# Patient Record
Sex: Female | Born: 1935 | Race: White | Hispanic: No | Smoking: Never smoker
Health system: Southern US, Community
[De-identification: ages and names within clinical notes are randomized; demographics above are authoritative.]

## PROBLEM LIST (undated history)

## (undated) DIAGNOSIS — Z9049 Acquired absence of other specified parts of digestive tract: Secondary | ICD-10-CM

## (undated) DIAGNOSIS — F09 Unspecified mental disorder due to known physiological condition: Secondary | ICD-10-CM

## (undated) DIAGNOSIS — N183 Chronic kidney disease, stage 3 unspecified: Secondary | ICD-10-CM

## (undated) DIAGNOSIS — I2510 Atherosclerotic heart disease of native coronary artery without angina pectoris: Secondary | ICD-10-CM

## (undated) DIAGNOSIS — I480 Paroxysmal atrial fibrillation: Secondary | ICD-10-CM

## (undated) DIAGNOSIS — I10 Essential (primary) hypertension: Secondary | ICD-10-CM

## (undated) DIAGNOSIS — I495 Sick sinus syndrome: Secondary | ICD-10-CM

## (undated) DIAGNOSIS — E785 Hyperlipidemia, unspecified: Secondary | ICD-10-CM

## (undated) DIAGNOSIS — Z950 Presence of cardiac pacemaker: Secondary | ICD-10-CM

## (undated) DIAGNOSIS — R911 Solitary pulmonary nodule: Secondary | ICD-10-CM

## (undated) DIAGNOSIS — R55 Syncope and collapse: Secondary | ICD-10-CM

## (undated) DIAGNOSIS — Z8673 Personal history of transient ischemic attack (TIA), and cerebral infarction without residual deficits: Secondary | ICD-10-CM

## (undated) HISTORY — DX: Hyperlipidemia, unspecified: E78.5

## (undated) HISTORY — DX: Solitary pulmonary nodule: R91.1

## (undated) HISTORY — DX: Paroxysmal atrial fibrillation: I48.0

## (undated) HISTORY — DX: Chronic kidney disease, stage 3 (moderate): N18.3

## (undated) HISTORY — DX: Atherosclerotic heart disease of native coronary artery without angina pectoris: I25.10

## (undated) HISTORY — DX: Syncope and collapse: R55

## (undated) HISTORY — DX: Chronic kidney disease, stage 3 unspecified: N18.30

## (undated) HISTORY — DX: Sick sinus syndrome: I49.5

## (undated) HISTORY — DX: Essential (primary) hypertension: I10

## (undated) HISTORY — DX: Unspecified mental disorder due to known physiological condition: F09

## (undated) HISTORY — DX: Personal history of transient ischemic attack (TIA), and cerebral infarction without residual deficits: Z86.73

## (undated) HISTORY — DX: Acquired absence of other specified parts of digestive tract: Z90.49

## (undated) HISTORY — DX: Presence of cardiac pacemaker: Z95.0

---

## 1997-09-20 ENCOUNTER — Other Ambulatory Visit: Admission: RE | Admit: 1997-09-20 | Discharge: 1997-09-20 | Payer: Self-pay | Admitting: *Deleted

## 1999-01-09 ENCOUNTER — Other Ambulatory Visit: Admission: RE | Admit: 1999-01-09 | Discharge: 1999-01-09 | Payer: Self-pay | Admitting: *Deleted

## 1999-11-05 ENCOUNTER — Encounter (INDEPENDENT_AMBULATORY_CARE_PROVIDER_SITE_OTHER): Payer: Self-pay

## 1999-11-05 ENCOUNTER — Other Ambulatory Visit: Admission: RE | Admit: 1999-11-05 | Discharge: 1999-11-05 | Payer: Self-pay | Admitting: *Deleted

## 2000-01-29 ENCOUNTER — Other Ambulatory Visit: Admission: RE | Admit: 2000-01-29 | Discharge: 2000-01-29 | Payer: Self-pay | Admitting: *Deleted

## 2000-12-30 ENCOUNTER — Encounter: Admission: RE | Admit: 2000-12-30 | Discharge: 2001-03-30 | Payer: Self-pay | Admitting: *Deleted

## 2001-03-03 ENCOUNTER — Other Ambulatory Visit: Admission: RE | Admit: 2001-03-03 | Discharge: 2001-03-03 | Payer: Self-pay | Admitting: *Deleted

## 2002-07-01 ENCOUNTER — Encounter: Payer: Self-pay | Admitting: Gastroenterology

## 2002-07-01 ENCOUNTER — Encounter: Admission: RE | Admit: 2002-07-01 | Discharge: 2002-07-01 | Payer: Self-pay | Admitting: Gastroenterology

## 2002-07-15 ENCOUNTER — Ambulatory Visit (HOSPITAL_COMMUNITY): Admission: RE | Admit: 2002-07-15 | Discharge: 2002-07-15 | Payer: Self-pay | Admitting: Gastroenterology

## 2002-08-01 ENCOUNTER — Encounter: Payer: Self-pay | Admitting: Surgery

## 2002-08-02 ENCOUNTER — Encounter (INDEPENDENT_AMBULATORY_CARE_PROVIDER_SITE_OTHER): Payer: Self-pay | Admitting: *Deleted

## 2002-08-02 ENCOUNTER — Ambulatory Visit (HOSPITAL_COMMUNITY): Admission: RE | Admit: 2002-08-02 | Discharge: 2002-08-03 | Payer: Self-pay | Admitting: Surgery

## 2002-08-02 ENCOUNTER — Encounter: Payer: Self-pay | Admitting: Surgery

## 2002-12-28 ENCOUNTER — Ambulatory Visit (HOSPITAL_COMMUNITY): Admission: RE | Admit: 2002-12-28 | Discharge: 2002-12-28 | Payer: Self-pay | Admitting: Gastroenterology

## 2002-12-28 ENCOUNTER — Encounter (INDEPENDENT_AMBULATORY_CARE_PROVIDER_SITE_OTHER): Payer: Self-pay | Admitting: *Deleted

## 2003-01-02 ENCOUNTER — Ambulatory Visit (HOSPITAL_COMMUNITY): Admission: RE | Admit: 2003-01-02 | Discharge: 2003-01-02 | Payer: Self-pay | Admitting: Gastroenterology

## 2003-07-12 ENCOUNTER — Other Ambulatory Visit: Admission: RE | Admit: 2003-07-12 | Discharge: 2003-07-12 | Payer: Self-pay | Admitting: *Deleted

## 2003-12-19 HISTORY — PX: CATARACT EXTRACTION: SUR2

## 2004-03-07 ENCOUNTER — Encounter: Admission: RE | Admit: 2004-03-07 | Discharge: 2004-03-07 | Payer: Self-pay | Admitting: Gastroenterology

## 2005-01-17 DIAGNOSIS — R55 Syncope and collapse: Secondary | ICD-10-CM

## 2005-01-17 HISTORY — PX: ORIF HIP FRACTURE: SHX2125

## 2005-01-17 HISTORY — DX: Syncope and collapse: R55

## 2005-02-16 ENCOUNTER — Ambulatory Visit: Payer: Self-pay | Admitting: Physical Medicine & Rehabilitation

## 2005-02-16 ENCOUNTER — Inpatient Hospital Stay (HOSPITAL_COMMUNITY): Admission: AD | Admit: 2005-02-16 | Discharge: 2005-02-21 | Payer: Self-pay | Admitting: Orthopaedic Surgery

## 2005-02-21 ENCOUNTER — Inpatient Hospital Stay (HOSPITAL_COMMUNITY)
Admission: RE | Admit: 2005-02-21 | Discharge: 2005-03-03 | Payer: Self-pay | Admitting: Physical Medicine & Rehabilitation

## 2005-02-21 ENCOUNTER — Ambulatory Visit: Payer: Self-pay | Admitting: Physical Medicine & Rehabilitation

## 2005-04-17 HISTORY — PX: PACEMAKER INSERTION: SHX728

## 2005-05-04 ENCOUNTER — Inpatient Hospital Stay (HOSPITAL_COMMUNITY): Admission: AD | Admit: 2005-05-04 | Discharge: 2005-05-20 | Payer: Self-pay | Admitting: Cardiology

## 2005-05-05 DIAGNOSIS — Z950 Presence of cardiac pacemaker: Secondary | ICD-10-CM

## 2005-05-05 HISTORY — DX: Presence of cardiac pacemaker: Z95.0

## 2005-05-15 ENCOUNTER — Encounter: Payer: Self-pay | Admitting: Vascular Surgery

## 2005-06-11 ENCOUNTER — Encounter: Admission: RE | Admit: 2005-06-11 | Discharge: 2005-06-11 | Payer: Self-pay | Admitting: Thoracic Surgery

## 2005-06-17 DIAGNOSIS — I2510 Atherosclerotic heart disease of native coronary artery without angina pectoris: Secondary | ICD-10-CM

## 2005-06-17 HISTORY — DX: Atherosclerotic heart disease of native coronary artery without angina pectoris: I25.10

## 2005-06-28 ENCOUNTER — Inpatient Hospital Stay (HOSPITAL_COMMUNITY): Admission: AD | Admit: 2005-06-28 | Discharge: 2005-07-04 | Payer: Self-pay | Admitting: *Deleted

## 2005-07-18 DIAGNOSIS — Z9049 Acquired absence of other specified parts of digestive tract: Secondary | ICD-10-CM

## 2005-07-18 HISTORY — DX: Acquired absence of other specified parts of digestive tract: Z90.49

## 2005-07-18 HISTORY — PX: LAPAROSCOPIC CHOLECYSTECTOMY: SUR755

## 2005-07-24 ENCOUNTER — Inpatient Hospital Stay (HOSPITAL_COMMUNITY): Admission: EM | Admit: 2005-07-24 | Discharge: 2005-08-02 | Payer: Self-pay | Admitting: Cardiovascular Disease

## 2005-07-28 ENCOUNTER — Encounter (INDEPENDENT_AMBULATORY_CARE_PROVIDER_SITE_OTHER): Payer: Self-pay | Admitting: Cardiovascular Disease

## 2005-11-26 ENCOUNTER — Inpatient Hospital Stay (HOSPITAL_COMMUNITY): Admission: EM | Admit: 2005-11-26 | Discharge: 2005-11-27 | Payer: Self-pay | Admitting: Emergency Medicine

## 2007-07-05 IMAGING — CR DG CHEST 1V PORT
1 series · 1 of 1 positions shown · non-contrast
Comparison: 05/06/05
Left chest tube remains and there is no change in the 15-20% left apical pneumothorax.

CLINICAL DATA: 3rd degree heart block.  Followup.
PORTABLE CHEST- 1 VIEW (9881 hours):

[view not recorded]
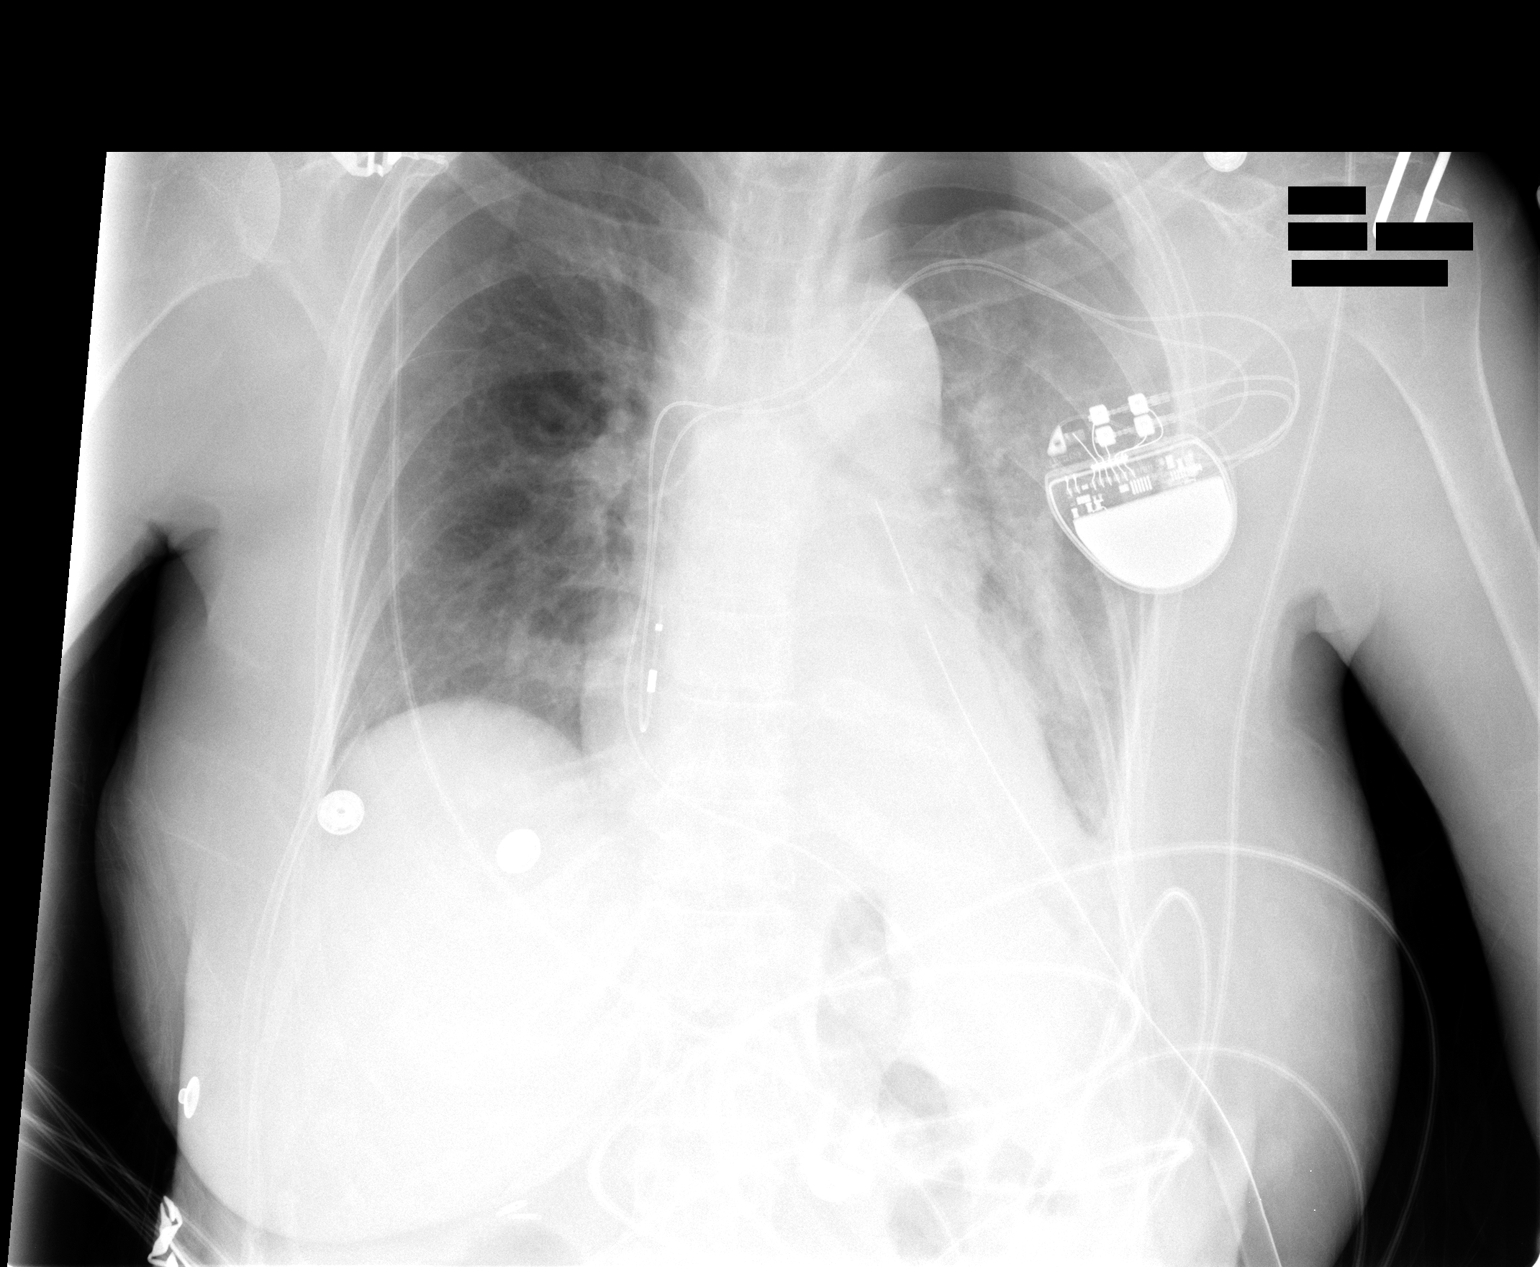

[1 of 1 positions shown; findings below may reference images not displayed]

Opacity remains at the left base consistent with atelectasis and possible residual effusion.  The right lung is clear.  Pacer remains.
IMPRESSION: No change in 15-20% left apical pneumothorax with a left chest tube present.  No change in opacity at the left lung base.

## 2007-07-06 IMAGING — CR DG CHEST 1V PORT
1 series · 1 of 1 positions shown · non-contrast
Comparison: 05/08/05.

CLINICAL DATA: Third-degree heart block.
 PORTABLE CHEST ? 1 VIEW:

[view not recorded]
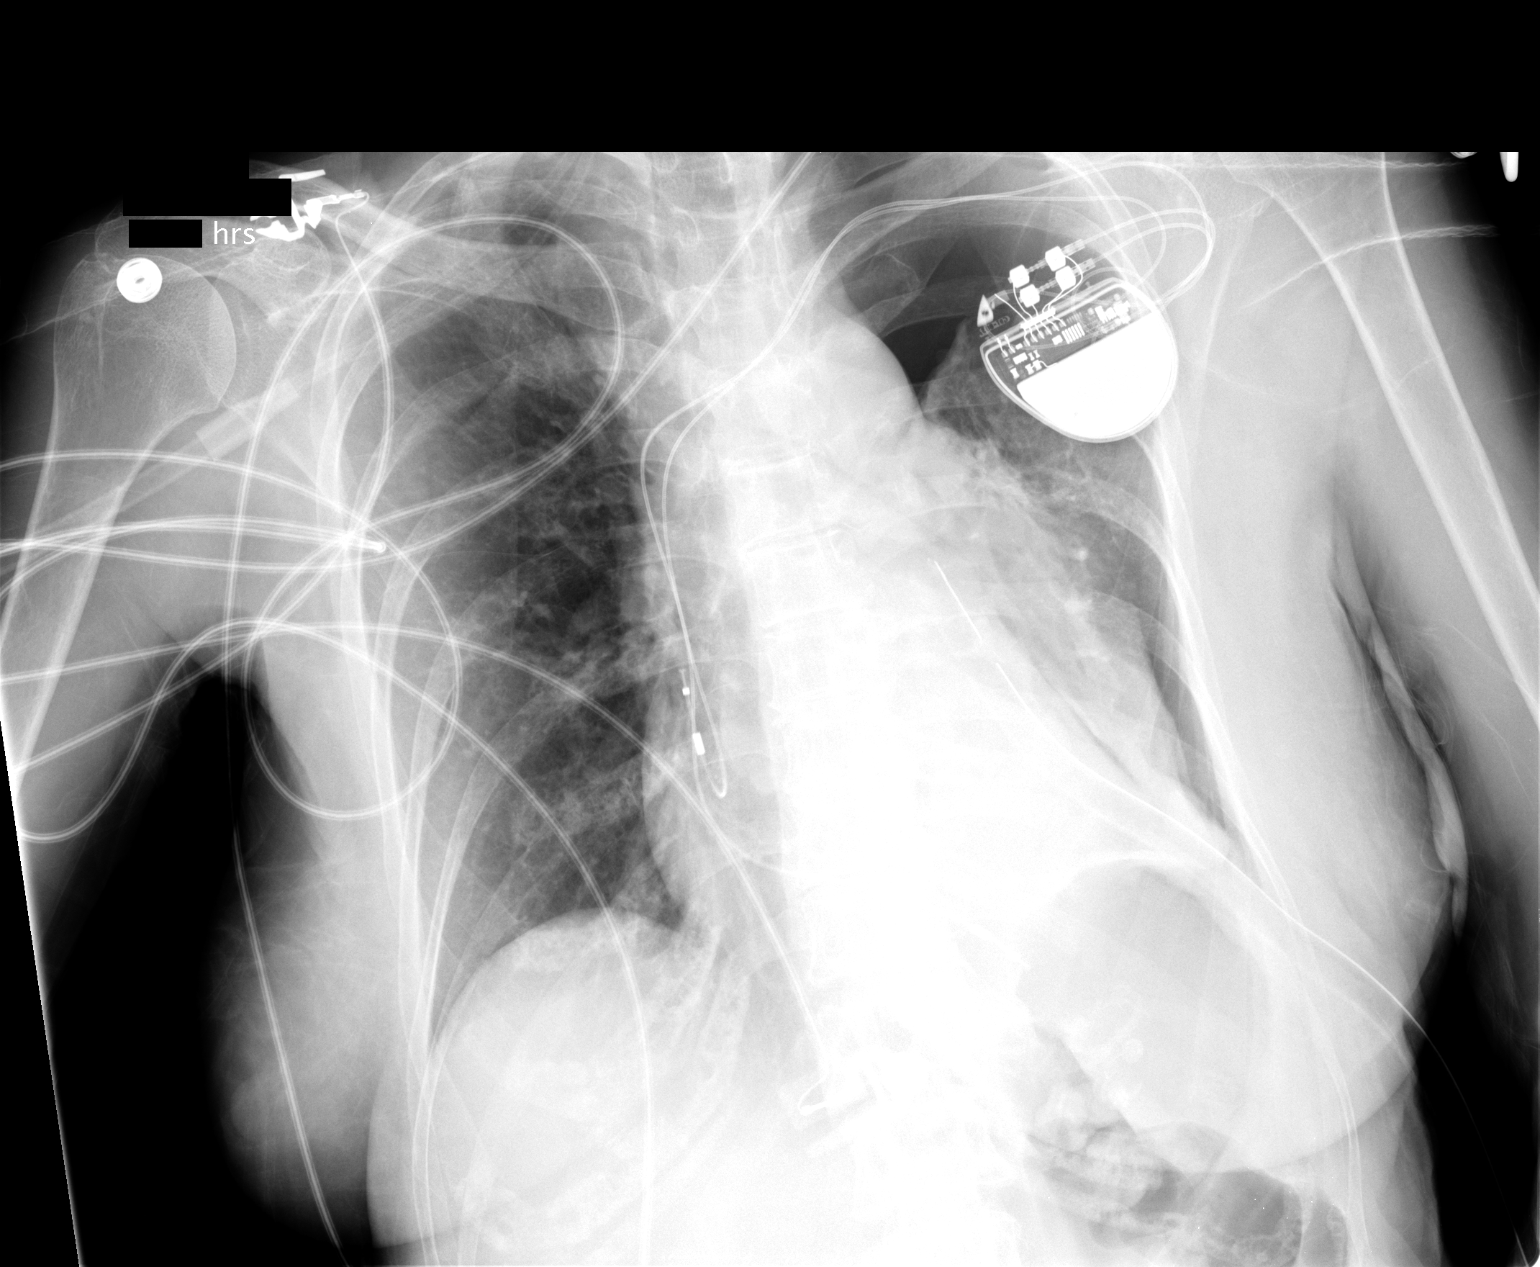

[1 of 1 positions shown; findings below may reference images not displayed]

FINDINGS: There is a left-sided pacer with leads in the projection of the right atrial appendage and right ventricle.  Left-sided chest tube is noted.  Persistent left pneumothorax is again noted not changed when compared with the prior exam.
IMPRESSION: Persistent large left pneumothorax.

## 2007-10-28 ENCOUNTER — Inpatient Hospital Stay (HOSPITAL_COMMUNITY): Admission: AD | Admit: 2007-10-28 | Discharge: 2007-10-30 | Payer: Self-pay | Admitting: Cardiovascular Disease

## 2008-09-17 DIAGNOSIS — R911 Solitary pulmonary nodule: Secondary | ICD-10-CM

## 2008-09-17 HISTORY — DX: Solitary pulmonary nodule: R91.1

## 2008-10-08 ENCOUNTER — Inpatient Hospital Stay (HOSPITAL_COMMUNITY): Admission: EM | Admit: 2008-10-08 | Discharge: 2008-10-10 | Payer: Self-pay | Admitting: Cardiovascular Disease

## 2008-10-10 ENCOUNTER — Ambulatory Visit: Payer: Self-pay | Admitting: Internal Medicine

## 2008-10-20 ENCOUNTER — Encounter: Admission: RE | Admit: 2008-10-20 | Discharge: 2008-10-20 | Payer: Self-pay | Admitting: Gastroenterology

## 2008-11-15 ENCOUNTER — Ambulatory Visit: Payer: Self-pay | Admitting: Internal Medicine

## 2008-11-15 DIAGNOSIS — I4891 Unspecified atrial fibrillation: Secondary | ICD-10-CM | POA: Insufficient documentation

## 2008-11-15 DIAGNOSIS — J984 Other disorders of lung: Secondary | ICD-10-CM

## 2008-11-15 DIAGNOSIS — M1990 Unspecified osteoarthritis, unspecified site: Secondary | ICD-10-CM | POA: Insufficient documentation

## 2008-11-15 DIAGNOSIS — K219 Gastro-esophageal reflux disease without esophagitis: Secondary | ICD-10-CM | POA: Insufficient documentation

## 2008-11-15 DIAGNOSIS — I2510 Atherosclerotic heart disease of native coronary artery without angina pectoris: Secondary | ICD-10-CM | POA: Insufficient documentation

## 2009-03-18 ENCOUNTER — Inpatient Hospital Stay (HOSPITAL_COMMUNITY): Admission: EM | Admit: 2009-03-18 | Discharge: 2009-03-23 | Payer: Self-pay | Admitting: Cardiology

## 2009-03-27 ENCOUNTER — Inpatient Hospital Stay (HOSPITAL_COMMUNITY): Admission: EM | Admit: 2009-03-27 | Discharge: 2009-03-30 | Payer: Self-pay | Admitting: Emergency Medicine

## 2009-03-27 ENCOUNTER — Telehealth (INDEPENDENT_AMBULATORY_CARE_PROVIDER_SITE_OTHER): Payer: Self-pay | Admitting: *Deleted

## 2009-03-27 ENCOUNTER — Ambulatory Visit: Payer: Self-pay | Admitting: Surgery

## 2009-03-27 ENCOUNTER — Encounter (INDEPENDENT_AMBULATORY_CARE_PROVIDER_SITE_OTHER): Payer: Self-pay | Admitting: Internal Medicine

## 2009-06-05 ENCOUNTER — Inpatient Hospital Stay (HOSPITAL_COMMUNITY): Admission: EM | Admit: 2009-06-05 | Discharge: 2009-06-07 | Payer: Self-pay | Admitting: Cardiovascular Disease

## 2009-06-11 ENCOUNTER — Ambulatory Visit (HOSPITAL_COMMUNITY): Admission: RE | Admit: 2009-06-11 | Discharge: 2009-06-11 | Payer: Self-pay | Admitting: Cardiovascular Disease

## 2009-07-05 ENCOUNTER — Ambulatory Visit: Payer: Self-pay | Admitting: Internal Medicine

## 2009-07-05 DIAGNOSIS — R05 Cough: Secondary | ICD-10-CM

## 2010-03-10 ENCOUNTER — Encounter: Payer: Self-pay | Admitting: Thoracic Surgery

## 2010-03-21 NOTE — Progress Notes (Signed)
----   Converted from flag ---- ---- 11/15/2008 4:39 PM, Nyoka Cowden MD wrote: [name] with cxr due to f/u pulmonary nodules ------------------------------  Dr. Sherene Sires, this pt had ov with you on 03/23/09 and cancelled becuause she was at [facility] after breaking her hip.  Now she is back at [facility] now because she had another fall.  I spoke with her son and he is aware that she needs followup and will have her make appt once she improves.  [name]  March 27, 2009 12:08 PM

## 2010-03-21 NOTE — Assessment & Plan Note (Signed)
Summary: Pulmonary/ ext summary final f/u ov  for mpn/cough   Primary Provider/Referring Provider:  Dr. [name]  CC:  Followup pulmonary nodules.  Pt had ct chest done 06/06/09.  Pt states that her breathing is fine and denies any complaints today.[name]  History of Present Illness: 60 yowf never regular smoker with incidental multiple pulmonary nodules  by CT 10/09/08  Post hospital followup.  Pt states that her breathing is a little better since hospital discharge.  She states that she does not feel that she breathes as deeply as she should sometimes.  intermittent  cough x 40 years!  no h/o hemoptysis.   Jul 05, 2009 Followup pulmonary nodules.  Pt had ct chest done 06/06/09.  Pt states that her breathing is fine and denies any complaints today. using 4 pronged rolling walker without  breathing limitations. No siignificant change in chronic cough dry assoc with mild dysphagia.  Pt denies any significant sore throat,  itching, sneezing,  nasal congestion or excess secretions,  fever, chills, sweats, unintended wt loss, pleuritic or exertional cp, hempoptysis, change in activity tolerance  orthopnea pnd or leg swelling .  Current Medications (verified): 1)  Aspirin 81 Mg Tbec (Aspirin) .[name].. 1 Once Daily 2)  Metoprolol Tartrate 25 Mg Tabs (Metoprolol Tartrate) .[name].. 1 Two Times A Day 3)  Lanoxin 0.125 Mg Tabs (Digoxin) .[name].. 1 Once Daily 4)  Fexofenadine Hcl 180 Mg Tabs (Fexofenadine Hcl) .[name].. 1 Once Daily 5)  Caltrate 600 1500 Mg Tabs (Calcium Carbonate) .[name].. 1 Two Times A Day 6)  Multivitamins  Tabs (Multiple Vitamin) .[name].. 1 Once Daily 7)  Welchol 625 Mg Tabs (Colesevelam Hcl) .... 3 Two Times A Day 8)  Flax Seed Oil 1000 Mg Caps (Flaxseed (Linseed)) .[name].. 1 Three Times A Day With Meals 9)  Protonix 40 Mg Tbec (Pantoprazole Sodium) .[name].. 1 Once Daily 10)  Aricept 10 Mg Tabs (Donepezil Hcl) .[name].. 1 Once Daily 11)  Lexapro 10 Mg Tabs (Escitalopram Oxalate) .[name].. 1 Once Daily 12)  Seroquel 25 Mg Tabs  (Quetiapine Fumarate) .[name].. 1 Once Daily  Allergies (verified): 1)  ! Morphine 2)  ! Vicodin 3)  ! Codeine 4)  ! * Local Anesthetics 5)  ! Simvastatin  Past History:  Past Medical History: ATRIAL FIBRILLATION, PAROXYSMAL (ICD-427.31) GERD (ICD-530.81)    - Barium swallow 10/20/08 c/w presbyesophagus DEGENERATIVE JOINT DISEASE (ICD-715.90) CAD (ICD-414.00) MPN..........................................................................Kim KitchenWert    - CT chest 10/09/08  3 mpn rml, one nodule LUL > no change by 06/06/09  Vital Signs:  Patient profile:   77 year old female Weight:      94.31 pounds O2 Sat:      98 % on Room air Temp:     97.5 degrees F oral Pulse rate:   72 / minute BP sitting:   126 / 80  (left arm) Cuff size:   small  Vitals Entered By: Vernie Murders (Jul 05, 2009 8:49 AM)  O2 Flow:  Room air  Physical Exam  Additional Exam:  thin frail elderly wf nad with rolling walker  wt 98 > 94 Jul 05, 2009  mild kyphosis HEENT: nl dentition, turbinates, and orophanx. Nl external ear canals without cough reflex NECK :  without JVD/Nodes/TM/ nl carotid upstrokes bilaterally LUNGS: no acc muscle use, clear to A and P bilaterally without cough on insp or exp maneuvers CV:  RRR  no s3 or murmur or increase in P2, no edema  ABD:  soft and nontender with nl excursion in  the supine position. No bruits or organomegaly, bowel sounds nl MS:  warm without deformities, calf tenderness, cyanosis or clubbing SKIN: warm and dry without lesions      CT of Chest  Procedure date:  06/06/2009  Findings:      No change in bilateral nodules all < 4 mm Splenic artery aneurysm noted  Impression & Recommendations:  Problem # 1:  PULMONARY NODULE (ICD-518.89)  Muliple pulmonary nodules < 8 mm therefore below the radar screen for PET,  minimally invasive bx or f/u on cxr for that matter, and old xrays won't do any good here.  Most likely they are benign; however, given the mulitple sites  noted, there is no early option for surgical cure even if one of them turned out to be malignant. Therefore I had an extended discussion with the patient today lasting 15 to 20 minutes of a 25 minute visit on the following issues:   Discussed in detail all the  indications, usual  risks and alternatives  relative to the benefits with patient who agrees to proceed with conservative f/u.  See instructions for specific recommendations   Orders: Est. Patient Level IV (81191)  Problem # 2:  COUGH (ICD-786.2)  The most common causes of chronic cough in immunocompetent adults include: upper airway cough syndrome (UACS), previously referred to as postnasal drip syndrome,  caused by variety of rhinosinus conditions; (2) asthma; (3) GERD; (4) chronic bronchitis from cigarette smoking or other inhaled environmental irritants; (5) nonasthmatic eosinophilic bronchitis; and (6) bronchiectasis. These conditions, singly or in combination, have accounted for up to 94% of the causes of chronic cough in prospective studies.   Most likely this is  Classic Upper airway cough syndrome, so named because it's frequently impossible to sort out how much is  CR/sinusitis with freq throat clearing (which can be related to primary GERD)   vs  causing  secondary extra esophageal GERD from wide swings in gastric pressure that occur with throat clearing, promoting self use of mint and menthol lozenges that reduce the lower esophageal sphincter tone and exacerbate the problem further These are the same pts who not infrequently have failed to tolerate ace inhibitors,  dry powder inhalers or biphosphonates or report having reflux symptoms that don't respond to standard doses of PPI   Rec change ppi to ac daily and implement dietary restrictions. See instructions for specific recommendations   Orders: Est. Patient Level IV (47829)  Medications Added to Medication List This Visit: 1)  Protonix 40 Mg Tbec (Pantoprazole sodium) ....  Take  one 30-60 min before first meal of the day 2)  Aricept 10 Mg Tabs (Donepezil hcl) .[name].. 1 once daily 3)  Lexapro 10 Mg Tabs (Escitalopram oxalate) .[name].. 1 once daily 4)  Seroquel 25 Mg Tabs (Quetiapine fumarate) .[name].. 1 once daily  Patient Instructions: 1)  I don't recommend any more studies but if short of breath or cough or fever of unknown origin would do a plain cxr and if the nodules can't be seen on a plain cxr they are not likely at all to be the cause of your problem or neither further work up.  2)  Be sure to take protonix before bfast 3)  GERD (REFLUX)  is a common cause of respiratory symptoms. It commonly presents without heartburn and can be treated with medication, but also with lifestyle changes including avoidance of late meals, excessive alcohol, smoking cessation, and avoid fatty foods, chocolate, peppermint, colas, red wine, and acidic juices such  as orange juice. NO MINT OR MENTHOL PRODUCTS SO NO COUGH DROPS  4)  USE SUGARLESS CANDY INSTEAD (jolley ranchers)  5)  NO OIL BASED VITAMINS    CT of Chest  Procedure date:  06/06/2009  Findings:      No change in bilateral nodules all < 4 mm Splenic artery aneurysm noted

## 2010-05-06 LAB — CARDIAC PANEL(CRET KIN+CKTOT+MB+TROPI)
CK, MB: 3.7 ng/mL (ref 0.3–4.0)
CK, MB: 4.6 ng/mL — ABNORMAL HIGH (ref 0.3–4.0)
Relative Index: 1.5 (ref 0.0–2.5)
Relative Index: 1.6 (ref 0.0–2.5)
Relative Index: 1.7 (ref 0.0–2.5)
Total CK: 171 U/L (ref 7–177)
Total CK: 263 U/L — ABNORMAL HIGH (ref 7–177)
Troponin I: 0.01 ng/mL (ref 0.00–0.06)
Troponin I: 0.01 ng/mL (ref 0.00–0.06)
Troponin I: 0.02 ng/mL (ref 0.00–0.06)

## 2010-05-06 LAB — GLUCOSE, CAPILLARY
Glucose-Capillary: 132 mg/dL — ABNORMAL HIGH (ref 70–99)
Glucose-Capillary: 134 mg/dL — ABNORMAL HIGH (ref 70–99)
Glucose-Capillary: 149 mg/dL — ABNORMAL HIGH (ref 70–99)
Glucose-Capillary: 177 mg/dL — ABNORMAL HIGH (ref 70–99)
Glucose-Capillary: 178 mg/dL — ABNORMAL HIGH (ref 70–99)
Glucose-Capillary: 97 mg/dL (ref 70–99)

## 2010-05-06 LAB — CBC
HCT: 36.6 % (ref 36.0–46.0)
Hemoglobin: 12.7 g/dL (ref 12.0–15.0)
MCHC: 34.8 g/dL (ref 30.0–36.0)
MCV: 97.6 fL (ref 78.0–100.0)
Platelets: 147 10*3/uL — ABNORMAL LOW (ref 150–400)
RBC: 2.92 MIL/uL — ABNORMAL LOW (ref 3.87–5.11)
RBC: 3.75 MIL/uL — ABNORMAL LOW (ref 3.87–5.11)
RDW: 13.3 % (ref 11.5–15.5)
RDW: 14.3 % (ref 11.5–15.5)
WBC: 8.1 10*3/uL (ref 4.0–10.5)

## 2010-05-06 LAB — BASIC METABOLIC PANEL
BUN: 20 mg/dL (ref 6–23)
CO2: 32 mEq/L (ref 19–32)
Calcium: 8.2 mg/dL — ABNORMAL LOW (ref 8.4–10.5)
Chloride: 102 mEq/L (ref 96–112)
Creatinine, Ser: 1.3 mg/dL — ABNORMAL HIGH (ref 0.4–1.2)
GFR calc Af Amer: 47 mL/min — ABNORMAL LOW (ref >60)
GFR calc non Af Amer: 39 mL/min — ABNORMAL LOW (ref >60)
Glucose, Bld: 128 mg/dL — ABNORMAL HIGH (ref 70–99)
Potassium: 3.8 mEq/L (ref 3.5–5.1)
Sodium: 139 mEq/L (ref 135–145)

## 2010-05-06 LAB — URINE CULTURE: Colony Count: >=100000

## 2010-05-06 LAB — DIGOXIN LEVEL: Digoxin Level: 0.4 ng/mL — ABNORMAL LOW (ref 0.8–2.0)

## 2010-05-06 LAB — APTT: aPTT: 26 seconds (ref 24–37)

## 2010-05-06 LAB — URINALYSIS, ROUTINE W REFLEX MICROSCOPIC
Ketones, ur: NEGATIVE mg/dL
Protein, ur: NEGATIVE mg/dL
Urobilinogen, UA: 0.2 mg/dL (ref 0.0–1.0)

## 2010-05-06 LAB — BRAIN NATRIURETIC PEPTIDE: Pro B Natriuretic peptide (BNP): 100 pg/mL (ref 0.0–100.0)

## 2010-05-06 LAB — MAGNESIUM: Magnesium: 1.9 mg/dL (ref 1.5–2.5)

## 2010-05-06 LAB — TYPE AND SCREEN
ABO/RH(D): O POS
Antibody Screen: NEGATIVE

## 2010-05-06 LAB — PROTIME-INR
INR: 1.03 (ref 0.00–1.49)
Prothrombin Time: 13.4 seconds (ref 11.6–15.2)

## 2010-05-06 LAB — TSH: TSH: 2.705 u[IU]/mL (ref 0.350–4.500)

## 2010-05-07 LAB — CBC
HCT: 32.6 % — ABNORMAL LOW (ref 36.0–46.0)
Hemoglobin: 11.2 g/dL — ABNORMAL LOW (ref 12.0–15.0)
Hemoglobin: 13.4 g/dL (ref 12.0–15.0)
MCHC: 33.7 g/dL (ref 30.0–36.0)
MCHC: 34.2 g/dL (ref 30.0–36.0)
MCV: 96 fL (ref 78.0–100.0)
Platelets: 136 10*3/uL — ABNORMAL LOW (ref 150–400)
RBC: 4.14 MIL/uL (ref 3.87–5.11)
RDW: 14 % (ref 11.5–15.5)
RDW: 14.2 % (ref 11.5–15.5)

## 2010-05-07 LAB — CARDIAC PANEL(CRET KIN+CKTOT+MB+TROPI)
CK, MB: 2 ng/mL (ref 0.3–4.0)
Relative Index: INVALID (ref 0.0–2.5)
Relative Index: INVALID (ref 0.0–2.5)
Total CK: 28 U/L (ref 7–177)
Troponin I: 0.02 ng/mL (ref 0.00–0.06)

## 2010-05-07 LAB — BASIC METABOLIC PANEL
BUN: 20 mg/dL (ref 6–23)
CO2: 25 mEq/L (ref 19–32)
CO2: 28 mEq/L (ref 19–32)
Calcium: 10.1 mg/dL (ref 8.4–10.5)
Calcium: 8.8 mg/dL (ref 8.4–10.5)
Creatinine, Ser: 1.38 mg/dL — ABNORMAL HIGH (ref 0.4–1.2)
Creatinine, Ser: 1.43 mg/dL — ABNORMAL HIGH (ref 0.4–1.2)
GFR calc Af Amer: 43 mL/min — ABNORMAL LOW (ref >60)
GFR calc non Af Amer: 35 mL/min — ABNORMAL LOW (ref >60)
Glucose, Bld: 99 mg/dL (ref 70–99)
Sodium: 138 mEq/L (ref 135–145)

## 2010-05-07 LAB — PROTIME-INR: Prothrombin Time: 14.6 seconds (ref 11.6–15.2)

## 2010-05-07 LAB — TSH: TSH: 1.656 u[IU]/mL (ref 0.350–4.500)

## 2010-05-09 LAB — VITAMIN B1: Vitamin B1 (Thiamine): 49 nmol/L — ABNORMAL HIGH (ref 9–44)

## 2010-05-09 LAB — CBC
HCT: 26.4 % — ABNORMAL LOW (ref 36.0–46.0)
Hemoglobin: 9.2 g/dL — ABNORMAL LOW (ref 12.0–15.0)
Hemoglobin: 9.6 g/dL — ABNORMAL LOW (ref 12.0–15.0)
MCHC: 35 g/dL (ref 30.0–36.0)
RBC: 2.72 MIL/uL — ABNORMAL LOW (ref 3.87–5.11)
RBC: 2.87 MIL/uL — ABNORMAL LOW (ref 3.87–5.11)
RDW: 14 % (ref 11.5–15.5)

## 2010-05-09 LAB — RETICULOCYTES: Retic Count, Absolute: 76.2 10*3/uL (ref 19.0–186.0)

## 2010-05-09 LAB — POCT I-STAT, CHEM 8
BUN: 26 mg/dL — ABNORMAL HIGH (ref 6–23)
Chloride: 109 mEq/L (ref 96–112)
Creatinine, Ser: 1.6 mg/dL — ABNORMAL HIGH (ref 0.4–1.2)
Glucose, Bld: 121 mg/dL — ABNORMAL HIGH (ref 70–99)
HCT: 27 % — ABNORMAL LOW (ref 36.0–46.0)
Potassium: 3.7 mEq/L (ref 3.5–5.1)

## 2010-05-09 LAB — COMPREHENSIVE METABOLIC PANEL
ALT: 21 U/L (ref 0–35)
Alkaline Phosphatase: 31 U/L — ABNORMAL LOW (ref 39–117)
CO2: 26 mEq/L (ref 19–32)
Calcium: 8.3 mg/dL — ABNORMAL LOW (ref 8.4–10.5)
GFR calc non Af Amer: 42 mL/min — ABNORMAL LOW (ref >60)
Glucose, Bld: 106 mg/dL — ABNORMAL HIGH (ref 70–99)
Sodium: 138 mEq/L (ref 135–145)

## 2010-05-09 LAB — TSH: TSH: 0.812 u[IU]/mL (ref 0.350–4.500)

## 2010-05-09 LAB — CARDIAC PANEL(CRET KIN+CKTOT+MB+TROPI)
CK, MB: 5.3 ng/mL — ABNORMAL HIGH (ref 0.3–4.0)
CK, MB: 6.2 ng/mL (ref 0.3–4.0)
Relative Index: 1.8 (ref 0.0–2.5)
Total CK: 231 U/L — ABNORMAL HIGH (ref 7–177)
Total CK: 310 U/L — ABNORMAL HIGH (ref 7–177)
Troponin I: 0.02 ng/mL (ref 0.00–0.06)
Troponin I: 0.02 ng/mL (ref 0.00–0.06)

## 2010-05-09 LAB — BASIC METABOLIC PANEL
GFR calc Af Amer: 56 mL/min — ABNORMAL LOW (ref >60)
GFR calc non Af Amer: 46 mL/min — ABNORMAL LOW (ref >60)
Glucose, Bld: 92 mg/dL (ref 70–99)
Potassium: 3.5 mEq/L (ref 3.5–5.1)
Sodium: 141 mEq/L (ref 135–145)

## 2010-05-09 LAB — URINALYSIS, ROUTINE W REFLEX MICROSCOPIC
Bilirubin Urine: NEGATIVE
Glucose, UA: NEGATIVE mg/dL
Hgb urine dipstick: NEGATIVE
Nitrite: NEGATIVE
Specific Gravity, Urine: 1.015 (ref 1.005–1.030)
pH: 6.5 (ref 5.0–8.0)

## 2010-05-09 LAB — DIFFERENTIAL
Basophils Absolute: 0 10*3/uL (ref 0.0–0.1)
Eosinophils Relative: 3 % (ref 0–5)
Lymphocytes Relative: 20 % (ref 12–46)
Lymphs Abs: 1.5 10*3/uL (ref 0.7–4.0)
Monocytes Absolute: 0.6 10*3/uL (ref 0.1–1.0)
Monocytes Relative: 8 % (ref 3–12)

## 2010-05-09 LAB — PROTIME-INR
INR: 2.29 — ABNORMAL HIGH (ref 0.00–1.49)
INR: 2.51 — ABNORMAL HIGH (ref 0.00–1.49)
INR: 2.97 — ABNORMAL HIGH (ref 0.00–1.49)
Prothrombin Time: 25 seconds — ABNORMAL HIGH (ref 11.6–15.2)
Prothrombin Time: 26.9 seconds — ABNORMAL HIGH (ref 11.6–15.2)
Prothrombin Time: 30.7 seconds — ABNORMAL HIGH (ref 11.6–15.2)

## 2010-05-09 LAB — IRON AND TIBC
Iron: 37 ug/dL — ABNORMAL LOW (ref 42–135)
UIBC: 226 ug/dL

## 2010-05-09 LAB — FOLATE
Folate: >20 ng/mL
Folate: >20 ng/mL

## 2010-05-10 LAB — CBC
HCT: 26.1 % — ABNORMAL LOW (ref 36.0–46.0)
Hemoglobin: 8.8 g/dL — ABNORMAL LOW (ref 12.0–15.0)
MCHC: 34.1 g/dL (ref 30.0–36.0)
MCV: 100.2 fL — ABNORMAL HIGH (ref 78.0–100.0)
Platelets: 112 10*3/uL — ABNORMAL LOW (ref 150–400)
Platelets: 119 10*3/uL — ABNORMAL LOW (ref 150–400)
Platelets: 140 10*3/uL — ABNORMAL LOW (ref 150–400)
Platelets: 152 10*3/uL (ref 150–400)
RDW: 13.2 % (ref 11.5–15.5)
RDW: 13.9 % (ref 11.5–15.5)
RDW: 14 % (ref 11.5–15.5)
WBC: 6 10*3/uL (ref 4.0–10.5)
WBC: 6.2 10*3/uL (ref 4.0–10.5)
WBC: 7.9 10*3/uL (ref 4.0–10.5)

## 2010-05-10 LAB — BASIC METABOLIC PANEL
BUN: 19 mg/dL (ref 6–23)
BUN: 19 mg/dL (ref 6–23)
BUN: 20 mg/dL (ref 6–23)
BUN: 24 mg/dL — ABNORMAL HIGH (ref 6–23)
CO2: 30 mEq/L (ref 19–32)
Calcium: 8.4 mg/dL (ref 8.4–10.5)
Calcium: 8.8 mg/dL (ref 8.4–10.5)
Calcium: 8.8 mg/dL (ref 8.4–10.5)
Creatinine, Ser: 1.14 mg/dL (ref 0.4–1.2)
Creatinine, Ser: 1.24 mg/dL — ABNORMAL HIGH (ref 0.4–1.2)
Creatinine, Ser: 1.25 mg/dL — ABNORMAL HIGH (ref 0.4–1.2)
Creatinine, Ser: 1.44 mg/dL — ABNORMAL HIGH (ref 0.4–1.2)
GFR calc Af Amer: 50 mL/min — ABNORMAL LOW (ref >60)
GFR calc non Af Amer: 35 mL/min — ABNORMAL LOW (ref >60)
GFR calc non Af Amer: 41 mL/min — ABNORMAL LOW (ref >60)
GFR calc non Af Amer: 41 mL/min — ABNORMAL LOW (ref >60)
Glucose, Bld: 115 mg/dL — ABNORMAL HIGH (ref 70–99)
Glucose, Bld: 128 mg/dL — ABNORMAL HIGH (ref 70–99)
Glucose, Bld: 131 mg/dL — ABNORMAL HIGH (ref 70–99)
Potassium: 4.3 mEq/L (ref 3.5–5.1)
Sodium: 137 mEq/L (ref 135–145)

## 2010-05-10 LAB — PROTIME-INR
INR: 1.54 — ABNORMAL HIGH (ref 0.00–1.49)
INR: 1.62 — ABNORMAL HIGH (ref 0.00–1.49)
Prothrombin Time: 19.1 seconds — ABNORMAL HIGH (ref 11.6–15.2)
Prothrombin Time: 25 seconds — ABNORMAL HIGH (ref 11.6–15.2)
Prothrombin Time: 25.5 seconds — ABNORMAL HIGH (ref 11.6–15.2)

## 2010-05-10 LAB — CARDIAC PANEL(CRET KIN+CKTOT+MB+TROPI)
CK, MB: 2.9 ng/mL (ref 0.3–4.0)
Relative Index: 0.6 (ref 0.0–2.5)
Total CK: 325 U/L — ABNORMAL HIGH (ref 7–177)
Troponin I: 0.02 ng/mL (ref 0.00–0.06)
Troponin I: 0.02 ng/mL (ref 0.00–0.06)

## 2010-05-10 LAB — GLUCOSE, CAPILLARY
Glucose-Capillary: 121 mg/dL — ABNORMAL HIGH (ref 70–99)
Glucose-Capillary: 145 mg/dL — ABNORMAL HIGH (ref 70–99)

## 2010-05-10 LAB — MRSA PCR SCREENING: MRSA by PCR: NEGATIVE

## 2010-05-25 LAB — CBC
Platelets: 169 10*3/uL (ref 150–400)
RDW: 13 % (ref 11.5–15.5)

## 2010-05-25 LAB — BASIC METABOLIC PANEL
GFR calc non Af Amer: 39 mL/min — ABNORMAL LOW (ref >60)
Glucose, Bld: 123 mg/dL — ABNORMAL HIGH (ref 70–99)
Potassium: 3.5 mEq/L (ref 3.5–5.1)
Sodium: 140 mEq/L (ref 135–145)

## 2010-05-25 LAB — GLUCOSE, CAPILLARY
Glucose-Capillary: 106 mg/dL — ABNORMAL HIGH (ref 70–99)
Glucose-Capillary: 144 mg/dL — ABNORMAL HIGH (ref 70–99)
Glucose-Capillary: 78 mg/dL (ref 70–99)
Glucose-Capillary: 89 mg/dL (ref 70–99)

## 2010-05-25 LAB — URINE MICROSCOPIC-ADD ON

## 2010-05-25 LAB — TSH: TSH: 1.28 u[IU]/mL (ref 0.350–4.500)

## 2010-05-25 LAB — URINALYSIS, ROUTINE W REFLEX MICROSCOPIC
Bilirubin Urine: NEGATIVE
Glucose, UA: NEGATIVE mg/dL
Nitrite: NEGATIVE
Specific Gravity, Urine: 1.007 (ref 1.005–1.030)
pH: 7 (ref 5.0–8.0)

## 2010-05-25 LAB — HEPARIN LEVEL (UNFRACTIONATED)
Heparin Unfractionated: 0.41 IU/mL (ref 0.30–0.70)
Heparin Unfractionated: 1.03 IU/mL — ABNORMAL HIGH (ref 0.30–0.70)

## 2010-05-25 LAB — CARDIAC PANEL(CRET KIN+CKTOT+MB+TROPI)
CK, MB: 2.2 ng/mL (ref 0.3–4.0)
Total CK: 95 U/L (ref 7–177)

## 2010-07-02 NOTE — Cardiovascular Report (Signed)
[name], [name]              ACCOUNT NO.:  [id number]   MEDICAL RECORD NO.:  [id number]          PATIENT TYPE:  INP   LOCATION:  4705                         FACILITY:  [facility]   PHYSICIAN:  Nanetta Batty, M.D.   DATE OF BIRTH:  [birth year]   DATE OF PROCEDURE:  10/29/2007  DATE OF DISCHARGE:                            CARDIAC CATHETERIZATION   Ms. [name] is an 77 year old female with history of PAF status post  transvenous pacemaker implantation in the past.  She has had a stroke in  the past as well as GERD.  She had an LAD stent placed in the mid LAD by  Dr. Lenise Herald, May 2007 (bare-metal stent).  She was cathed 2  months later by Dr. Tresa Endo revealing widely patent stent.  She was  admitted yesterday with back pain similar to her pre-stent implantation  symptoms.  She ruled out for myocardial infarction.  She was paced, so  her EKG was uninterpretable.  She presents now for diagnostic coronary  arteriography to rule out an ischemic etiology.   DESCRIPTION OF PROCEDURE:  The patient was brought to the second floor  [facility] in the postabsorptive state.  She is  premedicated p.o. Valium.  Her right groin was prepped and shaved in  usual sterile.  Xylocaine 1% was used for local anesthesia.  A 6-French  sheath was inserted into the right femoral artery using standard  Seldinger technique.  A 6-French right and left Judkins diagnostic  catheter as well as a 6-French pigtail catheter were used for selective  angiography and left ventriculography respectively.  Visipaque dye was  used entirety of the case.  Retrograde aorta, ventricular, and pullback  pressures were recorded.   HEMODYNAMIC RESULTS:  1. Aortic systolic pressure 159, diastolic pressure 71.  2. Left ventricle systolic pressure 153, end-diastolic pressure 12.   SELECTIVE CORONARY ANGIOGRAPHY:  1. Left main normal.  2. LAD; the mid-LAD stent had approximately 20-30% in-stent       restenosis.  3. 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  4. Right coronary artery; dominant, with at most 20% segmental mid      stenosis.  5. Left ventriculography; RAO left ventriculogram was performed using      25 mL of Visipaque dye at 12 mL per second.  The overall LVEF      estimated greater than 70% with mid cavity obliteration.  There is      no pullback gradient noted.   IMPRESSION:  Ms. Knieriem has essentially normal coronary arteries and  normal left ventricular function.  I think her back pain is noncardiac.  Sheath was removed and pressure was held on groin to achieve hemostasis.  The patient left lab in stable condition.  She will be discharged home  in the morning and will follow up with Dr. Alanda Amass in 1-2 weeks.  She  left lab in stable condition.      Nanetta Batty, M.D.  Electronically Signed     JB/MEDQ  D:  10/29/2007  T:  10/30/2007  Job:  [id number]   cc:   [facility]  [facility]  Richard A. Alanda Amass, M.D.

## 2010-07-02 NOTE — H&P (Signed)
[name], [name]              ACCOUNT NO.:  [id number]   MEDICAL RECORD NO.:  [id number]          PATIENT TYPE:  INP   LOCATION:  4705                         FACILITY:  [facility]   PHYSICIAN:  Richard A. Alanda Amass, M.D.DATE OF BIRTH:  29-Sep-1924   DATE OF ADMISSION:  10/28/2007  DATE OF DISCHARGE:                              HISTORY & PHYSICAL   ADDENDUM:  Review of [name]'s previous records indicates that her  original presentation in May of 2007 prior to her PCI was very similar  with severe pain across her back.  Based on this, we will hold off on  her T-spine films and give her one dose of Lovenox tonight pending a  possible catheterization tomorrow.      Abelino Derrick, P.A.      Richard A. Alanda Amass, M.D.  Electronically Signed    LKK/MEDQ  D:  10/28/2007  T:  10/28/2007  Job:  [id number]

## 2010-07-02 NOTE — Discharge Summary (Signed)
[name], HEGSTROM              ACCOUNT NO.:  [id number]   MEDICAL RECORD NO.:  [id number]          PATIENT TYPE:  INP   LOCATION:  3705                         FACILITY:  [facility]   PHYSICIAN:  Richard A. Alanda Amass, M.D.DATE OF BIRTH:  [birth year]   DATE OF ADMISSION:  10/08/2008  DATE OF DISCHARGE:  10/10/2008                               DISCHARGE SUMMARY   DISCHARGE DIAGNOSES:  1. Back pain, similar to premyocardial infarction symptoms, myocardial      infarction ruled out this admission with negative troponins and      nonischemic Myoview.  2. Coronary disease with left anterior descending, non-drug-eluting      stenting in May 2007, with multiple admissions for back pain since,      her last catheterization was in September 2009, which showed      essentially patent left anterior descending with otherwise normal      coronaries.  3. Normal left ventricular function.  4. Paroxysmal atrial fibrillation and sick sinus syndrome with a      Guidant pacemaker which was implanted in March 2007, that      hospitalization was complicated by a left upper extremity deep      venous thrombosis which required Coumadin for.  5. Gastroesophageal reflux with positive symptoms of some dysphagia on      this admission.  6. Degenerative joint disease.   HOSPITAL COURSE:  The patient is an 77 year old female who had a non-DES  stent placed to her LAD in May 2007.  She presented with back pain  initially and apparently was sent home from the emergency room and then  had an MI at home and came back to the hospital.  She had re-look in  June 2007, and again in September 2009, that showed no restenosis.  She  was seen in the emergency room in [place] on October 08, 2008, after she  awakened with some pain that is really right posterior chest under her  right ribcage.  She went to the emergency room and they told her that  she could go home, but her son was very concerned and wanted her  transferred to [facility] for further evaluation as her symptoms were similar  to her pre MI symptoms.  The patient did say this pain was much worse  than usual.  She was admitted to telemetry.  CK-MB and troponins were  obtained and these were negative.  Her D-dimer was elevated at 1.5.  CT  scan showed no pulmonary embolism, but she did have right middle lobe  nodules and a right breast calcified nodule.  The patient thinks she had  a mammogram approximately a year ago.  She underwent Lexiscan, Myoview  on October 09, 2008, that was negative for ischemia and showed normal LV  function.  We did ask Dr. Sandrea Hughs to see her in consult for  pulmonary evaluation, he would like to see her in the office in 4-6  weeks.  She will also need a GI evaluation, but this can be done as an  outpatient.  We feel that she can be discharged on  October 10, 2008.   LABORATORY DATA:  White count 5.2, hemoglobin 13.5, hematocrit 39.1,  platelets 169.  Urinalysis shows only trace leukocytes, but otherwise is  normal.  Sodium 140, potassium 3.5, BUN 28, creatinine 1.3.  TSH 1.28.  CK-MB and troponins are negative x2.  EF was 77% at [facility], there was  some LV hypertrophy noted.  EKG shows paced rhythm.   DISCHARGE MEDICATIONS:  1. Tylenol 2 tablets q.4 p.r.n.  2. Maalox 30 mL p.r.n.  3. Welchol 3 tablets b.i.d.  4. Metoprolol tartrate 25 mg b.i.d.  5. Toviaz 25 mg daily.  6. Allegra 180 mg a day.  7. Caltrate once daily.  8. Centrum Silver daily.  9. Protonix 40 mg a day.  10.Aspirin 81 mg a day.  11.Nitroglycerin sublingual p.r.n. for severe pain.   DISPOSITION:  The patient is discharged in stable condition.  She will  follow up with Dr. Sandrea Hughs and Dr. [name] and Dr. Alanda Amass.  She has been instructed to contact Dr. Charna Elizabeth for GI evaluation,  Dr. Loreta Ave did do an endoscopy on her, I believe, in 2004.      Abelino Derrick, P.A.      Richard A. Alanda Amass, M.D.  Electronically  Signed    LKK/MEDQ  D:  10/10/2008  T:  10/11/2008  Job:  [id number]   cc:   Dr. Orion Modest. Sherene Sires, MD, FCCP  Jyothi Elsie Amis, M.D.

## 2010-07-02 NOTE — H&P (Signed)
[name], [name]              ACCOUNT NO.:  [id number]   MEDICAL RECORD NO.:  [id number]          PATIENT TYPE:  INP   LOCATION:  4705                         FACILITY:  [facility]   PHYSICIAN:  Richard A. Alanda Amass, M.D.DATE OF BIRTH:  1924-03-19   DATE OF ADMISSION:  10/28/2007  DATE OF DISCHARGE:                              HISTORY & PHYSICAL   CHIEF COMPLAINT:  Mid back pain.   HISTORY OF PRESENT ILLNESS:  [name] is a delightful 77 year old  female well-known to Dr. Alanda Amass who is followed by Dr. [name].  She has a history of coronary disease.  She had a IX DES placed to her  LAD in May 2007.  She had re-look in 01-Jul-2007and this site was patent.  She apparently has had several recent admissions for back pain.  She  thinks this is similar to her pre PCI symptoms.  She says Dr. Yetta Flock did  a stress test on her, this sounds like a Persantine Myoview.  This was  about 3 months ago and she said this was normal.  The patient describes  a midsternal back pain that awakens her usually around 3:00 a.m.  There  is no appreciable difference with nitroglycerin.  She denies any  associated nausea, vomiting or diaphoresis or shortness of breath.  Pain  is not worsened by movement.  She says overall it just makes me feel  awful.  She presented to the emergency room today in [place] with  another episode.  This one again happened at 3:00 a.m. in the morning.  Her troponins are negative.  Her EKG shows a paced rhythm.  Dr. Yetta Flock  called and wanted her transferred to [facility] for further cardiac evaluation  for recurrent mid back pain.   PAST MEDICAL HISTORY:  1. Remarkable for PAF and sick sinus syndrome.  She had a Guidant      pacemaker implanted in March of 2007.  This was complicated by a      pneumothorax requiring a chest tube.  Overall, I believe she has      been about 2 weeks in the hospital then.  She also suffered a left      upper extremity DVT after her pacemaker and  was put on Coumadin for      some time, but she is now off Coumadin.  2. She has a history of a chronic left bundle branch block.  3. Esophageal reflux.  4. Mild renal insufficiency.  5. She has had a prior TIA.   PREVIOUS SURGERIES:  1. Laparoscopic cholecystectomy in 08-17-2005.  2. Total hip replacement December 2006.   CURRENT MEDICATIONS:  1. Plavix 75 mg a day.  2. Baby aspirin daily.  3. Metoprolol 25 mg b.i.d.  4. Lanoxin 0.125 mg a day.  5. Allegra 180 mg a day.  6. Welchol 625 mg b.i.d.  7. Boost supplement.   ALLERGIES:  She is intolerant to CODEINE and intolerant to STATINS.   SOCIAL HISTORY:  She is a widow, her husband died in 08-17-2005.  She has  3 children, 2 grandchildren, 2  great-grandchildren, she is a long-time  nonsmoker and lives alone.   FAMILY HISTORY:  Unremarkable.   REVIEW OF SYSTEMS:  Essentially unremarkable except for noted above.  She denies any unusual exertional dyspnea.  She has not had fever or  chills or cough.   PHYSICAL EXAM:  Blood pressure 136/63, pulse 70, respirations 18.  GENERAL:  She is a well-developed thin female, she weighs about 95  pounds.  She is in no acute distress.  HEENT: Normocephalic, atraumatic.  Extraocular movements are intact.  Sclerae is nonicteric.  Lids and conjunctivae are within normal limits.  NECK:  Without JVD or bruit.  CHEST: Clear to auscultation and percussion.  CARDIAC:  Reveals regular rate and rhythm with a soft systolic murmur at  the left sternal border/ normal S1 and S2.  ABDOMEN: Nontender.  No hepatosplenomegaly.  No bruits.  EXTREMITIES: Without edema, distal pulses are intact.  NEURO:  Exam grossly intact.  She is awake, alert, oriented,  cooperative.  Moves all extremities without obvious deficit.   LABS:  CK and troponin are negative x2.  White count 4.9, hemoglobin  13.0, hematocrit 39.0, platelets 193, INR 1.0.  Sodium 140, potassium  4.2, BUN 43, creatinine 1.18.  LFTs were normal.   EKG is paced.  Chest x-  ray shows the heart size to be normal.  No acute cardiopulmonary  abnormalities.   IMPRESSION:  1. Mid back pain, question angina.  2. Known coronary disease with IX drug-eluting stent left anterior      descending intervention May 2007 which was patent, had re-look June      2007.  3  Paroxysmal atrial fibrillation and sick sinus syndrome, status post  Guidant pacemaker implant March 2007 complicated by pneumothorax  requiring chest tube placement.  4  Left upper extremity deep venous thrombosis post pacemaker implant,  the patient was on Coumadin for a time, but is now on aspirin and  Plavix.  1. Renal insufficiency, the patient appears to be mildly dehydrated.  2. Remote transient ischemic attack.  3. Chronic left bundle branch block.  4. History of gastroesophageal reflux.  5. History of statin intolerance.   PLAN:  The patient will be admitted to Telemetry.  We will check a third  enzyme.  I will go ahead and get thoracic spine films tonight to rule  out pressure fracture or degenerative joint disease of the thoracic  spine causing her recurrent pain.  She will be evaluated tomorrow and a  decision will be made regarding diagnostic catheterization.      Abelino Derrick, P.A.      Richard A. Alanda Amass, M.D.  Electronically Signed    LKK/MEDQ  D:  10/28/2007  T:  10/28/2007  Job:  [id number]

## 2010-07-02 NOTE — Cardiovascular Report (Signed)
[name], [name]              ACCOUNT NO.:  [id number]   MEDICAL RECORD NO.:  [id number]          PATIENT TYPE:  INP   LOCATION:  4705                         FACILITY:  [facility]   PHYSICIAN:  Nanetta Batty, M.D.   DATE OF BIRTH:  Aug 14, 1924   DATE OF PROCEDURE:  10/29/2007  DATE OF DISCHARGE:                            CARDIAC CATHETERIZATION   [name] is an 77 year old female with history of PAF, status post  permanent transvenous pacemaker insertion in the past.  She has remote  TIA, chronic right bundle branch block, and history of GERD.  She has  had an LAD bare-metal stent placed in her mid LAD by Dr. Jenne Campus, May  2007 with re-look months later by Dr. Tresa Endo revealing a widely patent  stent.    Dictation ended at this point.      Nanetta Batty, M.D.  Electronically Signed     JB/MEDQ  D:  10/29/2007  T:  10/30/2007  Job:  [id number]   cc:   Second [facility]  [facility]

## 2010-07-05 NOTE — H&P (Signed)
[name], [name]              ACCOUNT NO.:  [id number]   MEDICAL RECORD NO.:  [id number]          PATIENT TYPE:  IPS   LOCATION:  4036                         FACILITY:  [facility]   PHYSICIAN:  Erick Colace, M.D.DATE OF BIRTH:  1924/06/28   DATE OF ADMISSION:  02/21/2005  DATE OF DISCHARGE:                                HISTORY & PHYSICAL   REASON FOR ADMISSION:  Decline in self care and mobility skills following  left hip fracture.   HISTORY:  A 77 year old female with past medical history significant for  Afib and GERD who fell a few days prior admission to [facility] on  February 16, 2005.  She initially presented to an outside hospital.  She was  seen by orthopedist, Dr. Ophelia Charter, and underwent a left hip hemiarthroplasty on  February 17, 2005.  Postoperatively, made weightbearing as tolerated, was  placed on Coumadin for DVT prophylaxis.  She was started on Elavil for  decreased mood but feels that this may be giving her nightmares.  Therapy  was started and she was requiring moderate assistance for standing, balance,  and walker use.  She also required assistance for her self care skills.   REVIEW OF SYSTEMS:  Positive for reflux, positive for hyperactive bladder,  headache, occasional dizziness, anxiety, depression.   PAST MEDICAL HISTORY:  1.  TIAs.  2.  Borderline diabetes.  3.  Chronic renal insufficiency with baseline creatinine of 1.6.   PAST SURGICAL HISTORY:  1.  D&C.  2.  Cholecystectomy.  3.  Tonsillectomy.   FAMILY HISTORY:  Coronary artery disease and cancer.   SOCIAL HISTORY:  Married, lives with her husband.  Her husband has colon CA  and he is at the [facility] and she feels he will likely never make it  out of the hospital.  Home is one level with ramp at entry.  Daughter-in-  Social worker and two grandchildren live with them.  No tobacco, no ethanol usage.   FUNCTIONAL HISTORY:  Independent driving prior to admission.   FUNCTIONAL STATUS:   Mobility and ADLs are currently impaired as noted above.   HOME MEDICATIONS:  1.  Allegra 180 mg p.o. every day.  2.  Metoprolol 50 mg p.o. b.i.d.  3.  Omeprazole 40 mg p.o. b.i.d.  4.  Diltiazem 180 mg p.o. every day.  5.  Plavix 75 mg p.o. every day.  6.  Aspirin 81 mg p.o. every day.  7.  Caltrate.  8.  Centrum Silver.   ALLERGIES:  None known.  Does have some intolerance to morphine, statins,  and local anesthetics.   Last chest x-ray, February 15, 2005, showed no acute disease.  Her last  hemoglobin 9.1, white count 8.5, and platelets 215,000.  Her last INR, on  February 21, 2005, was 1.9.   PHYSICAL EXAMINATION:  VITAL SIGNS:  Blood pressure 96/56, pulse 89,  respirations 18, temp 97.4.  GENERAL:  A thin, elderly female in no acute distress.  HEENT:  Eyes:  Anicteric, not injected.  External ENT normal.  NECK:  Supple without adenopathy.  LUNGS:  Clear to auscultation.  HEART:  Regular rate and rhythm.  No rubs, murmurs, extra sounds.  EXTREMITIES:  Without clubbing, cyanosis, or edema.  Good pedal pulses.  ABDOMEN:  Positive bowel sounds.  Soft nontender palpation.  NEUROLOGIC:  Cranial nerves II-XII intact.  Sensation is normal.  Memory,  mood, and affect are normal.  Motor strength is 5/5 bilateral deltoid,  biceps, triceps, grip.  Right hip flexor, knee extensor, TA, and gastroc are  all 5- over 5.  Left side is 3- at the hip flexor and quad and 4- at the TA  and gastroc.   IMPRESSION:  1.  Left impacted proximal femoral neck fracture, status post      hemiarthroplasty, femoral component.  2.  Pain management with Vicodin effective with 3 out of 10 pain.  3.  Deep vein thrombosis prophylaxis.  Coumadin.  INR 1.9 close to      therapeutic.  Continue current.  4.  Situational depression.  Would like to discontinue Elavil.  We will      allow some as needed Xanax for anxiety.  5.  Chronic atrial fibrillation, controlled rate on Lopressor and Cardizem,      is on  chronic Coumadin.  6.  Acute blood loss anemia.  Continue iron supplementation, monitor      hemoglobin.  7.  Impaired glucose tolerance.  Monitor CBGs.  She has a low carbohydrate      diet.  8.  Hyperactive bladder.  We will give a trial of Ditropan, have      rehabilitation monitor for signs of retention.  We will monitor voiding      pattern.   ESTIMATED LENGTH OF STAY:  Seven to ten days.   The patient is a good rehab candidate.   ESTIMATED FUNCTION ON DISCHARGE:  Supervision to modified independent level.      Erick Colace, M.D.  Electronically Signed     AEK/MEDQ  D:  02/21/2005  T:  02/21/2005  Job:  [id number]   cc:   Gerlene Burdock A. Alanda Amass, M.D.  Fax: [phone]   ZHYQMV HQI ONGE, M.D.  Fax: [phone]   [name] Ophelia Charter, M.D.  Fax: [phone]

## 2010-07-05 NOTE — Discharge Summary (Signed)
[name], [name]              ACCOUNT NO.:  [id number]   MEDICAL RECORD NO.:  [id number]          PATIENT TYPE:  INP   LOCATION:  3730                         FACILITY:  [facility]   PHYSICIAN:  Cristy Hilts. Jacinto Halim, MD       DATE OF BIRTH:  09/19/1924   DATE OF ADMISSION:  05/04/2005  DATE OF DISCHARGE:  05/20/2005                                 DISCHARGE SUMMARY   DISCHARGE DIAGNOSES:  1.  Syncope secondary to number two.  2.  Complete heart block.      1.  Temporary pacemaker was placed in [facility] before transfer          to [facility].      2.  Permanent pacemaker DDDR Guidant Insignia 1+DR model [id number], serial          [id number].  Placed by Dr. Susa Griffins on May 05, 2005.  3.  Pneumothorax, improved though still present at discharge, estimated at      approximately 10% on the left pneumo.      1.  Chest tube placement, now removed.  4.  Blood loss anemia, resolved after transfusion of two units packed cells.  5.  Paroxysmal atrial flutter, lasted less than eight hours, continues in      sinus rhythm with pacing.  6.  Deep vein thrombosis to the left upper extremity on Coumadin.  7.  Debilitation.  8.  Sacral ulcer.  9.  History of cerebrovascular accident.  10. History of mitral valve prolapse.  11. Chronic renal insufficiency.  12. History of left bundle branch block.  13. History of right carotid bruit.   DISCHARGE CONDITION:  Improved.   PROCEDURES:  1.  May 05, 2005, implantation of a permanent DDDR Guidant Insignia 1+DR,      model #[id number], serial [id number], by Dr. Susa Griffins.  2.  May 06, 2005, left chest tube inserted by Dr. Edwyna Shell.   DISCHARGE MEDICATIONS:  1.  Allegra 180 mg daily.  2.  Lanoxin 0.125 mg daily.  3.  Prilosec 20 mg two twice a day.  4.  Multivitamin daily.  5.  Lopressor 50 mg one and a half tabs twice a day.  6.  Aspirin 81 mg daily.  7.  Coumadin 3 mg daily.  8.  Caltrate 600 + D as before.  9.  Benzonatate 200  mg one as needed p.r.n. for cough.  10. Imodium as before, one after each loose stool, no more than eight per      day.  11. No more diltiazem   DISCHARGE INSTRUCTIONS:  1.  Regular diet.  2.  May shower.  3.  Follow with Dr. Alanda Amass on June 09, 2005 at 12:15 p.m.  4.  Barrier cream to sacrum.  5.  Have pro time evaluated on Thursday, May 22, 2005, results to Dr.      Alanda Amass.  6.  Physical therapy and occupational therapy.   HISTORY OF PRESENT ILLNESS:  [name] admitted to  [name], on May 04, 2005, after she had breakfast and felt dizzy  and had syncopal spell.  She fell in the closet and hit her left occipital  area that has a bruise.  She, at [name], was found to be in complete heart  block, a temporary pacemaker was inserted by Dr. Judie Petit. Dhatt, transferred here  to [facility] for a permanent transvenous pacemaker on May 04, 2005.  Dr. Jacinto Halim saw her and admitted her for Dr. Alanda Amass.  She was  asymptomatic.  There was questionable ST-T wave flutter episodically.  She  also has a history of hypertension, TIA, mitral valve prolapse.  She also  has a history of left hip hemiarthroplasty, February 17, 2005.  Dr. Ophelia Charter did  that, also remote cholecystectomy, remote tonsillectomy.   PAST MEDICAL HISTORY:  A Cardiolite study was done, February 16, 2005, at  [facility] for ischemia.   OUTPATIENT MEDICATIONS:  1.  Diltiazem 180.  2.  Aspirin.  3.  Caltrate.  4.  Fexofenadine 180.  5.  Plavix 75.  6.  Prilosec two 20 mg daily.  7.  Metoprolol 50 b.i.d.  8.  Centrum Silver.  9.  Prednisone 20 daily.   ALLERGIES:  1.  CODEINE.  2.  Intolerant to STATINS.   FAMILY HISTORY:  See H&P.   SOCIAL HISTORY:  See H&P.   REVIEW OF SYSTEMS:  See H&P.   DISCHARGE PHYSICAL EXAMINATION:  GENERAL:  Alert, oriented white female  without complaints.  VITAL SIGNS:  Blood pressure 120/67, pulse 70, respirations 20, temp 97.2.  HEART:  S1  S2.  Regular rate and rhythm.  LUNGS:  Clear.  ABDOMEN:  Soft, nontender.  Positive bowel sounds.  EXTREMITIES:  No edema.  HEENT:  Left occipital area with ecchymosis that is resolving.  No edema.   LABORATORY DATA:  Hemoglobin 12.3, hematocrit 35.6, WBC 12.6, platelets 220  on admission and then after pacer hemoglobin dropped to 8 with a hematocrit  of 23.4.  She was transfused two units.  Hemoglobin came up to 12 with a  hematocrit of 34.  Hemoglobin remained stable thereafter.  Hemoglobin 11.6,  hematocrit 33.8 at discharge, platelets 415, WBC 6.2.  Pro time, on  admission, 14.6, INR 1.1, and PTT 25, was placed on heparin for a period of  time.  That was discontinued.  Prior to discharge, her INR is 2 off her  heparin on Coumadin.  Chemistries:   Sodium 137, potassium 3.6, chloride  107, CO2 26, glucose 137, BUN 19, creatinine 1, calcium 8.1.  These remained  stable throughout hospitalization.  BNP was 157.  TSH 1.486.  The patient  was typed and crossed, O positive with negative antibody screen.   X-RAYS:  May 06, 2005, left pneumothorax, status post pacer placement,  left effusion, nodular opacity right lung recommend followup CT of the  chest.  On May 07, 2005, no change and 15 to 20% left apical pneumo with a  left chest tube present.  No change in opacity of the left lung base.  Original x-ray, of May 06, 2005, moderate to large left hydropneumothorax.  On May 08, 2005, her pneumo increased to 40%, suction was increased on her  chest tube, because of persistent large left pneumo.  By May 09, 2005, it  was small at about 15%.  On May 10, 2005, enlargement of left pneumo and  then in the afternoon of May 10, 2005, stable small to 25% left apical  pneumo after removal of chest tube.  And since that time, the pneumo has  ranged from 10 to 25% and left lower lobe atelectasis.  EKG:  Initial EKG with complete heart block and temporary pacing and now A-V  pacing.  She  did have one episode of flutter but she had bundle branch block  throughout her EKGs.   HOSPITAL COURSE:  Ms. Kunath was admitted by Dr. Jacinto Halim, on May 04, 2005,  after being transferred from [facility] for syncope and a complete heart block  with a temporary pacer placed in [place].  She was admitted, planned for a  permanent transvenous pacemaker.  Aspirin and Plavix were held.  She had  been treated for a strep infection prior to this admission.  The patient was  pacer dependent on arrival as well.  She underwent the procedure for pacer,  tolerated that well, and then developed a pneumothorax.  A chest tube was  placed by Dr. Edwyna Shell.  One episode of paroxysmal atrial flutter that  resolved spontaneously.  The patient continued to slowly improve with her  pneumo.  After the initial drop in hemoglobin, after two units she  stabilized.  She continued to be stable.  Chest tube was removed and she was  transferred to telemetry, where she slowly progressed.  She complained of  left arm pain.  Doppler studies were done which revealed DVT of the left  upper extremity.  Coumadin was at that point started.  The patient continues  to improve.  By May 19, 2005, she was officially ready for discharge,  unfortunately her social situation is pretty tight in that she had no one to  stay with her.  Her husband is dying of cancer and her other son that  lives here had a hip replacement that day; therefore, on May 20, 2005, Dr.  Allyson Sabal talked at length to her son who lives in [place] and plans are to  place her in a rehab facility until she is able to stay by herself.  She and  her son both were agreeable.  A bed was found at [facility] and she  will be discharged to that bed.      Darcella Gasman. [place], N.P.      Cristy Hilts. Jacinto Halim, MD  Electronically Signed    LRI/MEDQ  D:  05/20/2005  T:  05/20/2005  Job:  [id number]   cc:   with the patient [facility]   Richard A. Alanda Amass, M.D.   Fax: [phone]   [name], Dr.   Janalyn Shy P. Pearlean Brownie, MD  Fax: [phone]   Ines Bloomer, M.D.  [address]  [place]  [place] [address]

## 2010-07-05 NOTE — Op Note (Signed)
NAME:  [name], STAFF                        ACCOUNT NO.:  [id number]   MEDICAL RECORD NO.:  [id number]                   PATIENT TYPE:  OIB   LOCATION:  NA                                   FACILITY:  [facility]   PHYSICIAN:  Abigail Miyamoto, M.D.              DATE OF BIRTH:  01/13/1925   DATE OF PROCEDURE:  08/02/2002  DATE OF DISCHARGE:                                 OPERATIVE REPORT   PREOPERATIVE DIAGNOSIS:  Biliary dyskinesia.   POSTOPERATIVE DIAGNOSIS:  Biliary dyskinesia.   OPERATION PERFORMED:  Laparoscopic cholecystectomy with intraoperative  cholangiogram.   SURGEON:  Douglas A. Magnus Ivan, M.D.   ASSISTANT:  Gita Kudo, M.D.   ANESTHESIA:  General endotracheal.   ESTIMATED BLOOD LOSS:  Minimal.   OPERATIVE FINDINGS:  The patient was found to have a normal cholangiogram.   DESCRIPTION OF PROCEDURE:  The patient was brought to the operating room and  identified as [name].  She was placed supine on the operating table  and general anesthesia was induced.  Her abdomen was then prepped and draped  in the usual sterile fashion.  Using a #15 blade a small transverse incision  was made below the umbilicus.  The incision was carried down to the fascia  which was then opened with a scalpel.  Hemostat was used to pass through the  peritoneal cavity.  Next, a #0 Vicryl pursestring suture was placed around  the fascial opening.  The Hasson port was placed through the opening and  insufflation of the abdomen was begun.  Next, an 11 mm port was placed in  the patient's epigastrium and two 5 mm ports were placed in the patient's  right flank under direct vision.  The gallbladder was then grasped and  retracted above the liver bed.  Dissection was then carried out at the base  of the gallbladder. The cystic duct and cystic artery were easily dissected  out.  The artery was clipped twice proximally, once distally and transected.  The duct was then clipped once  distally and partly opened with the  laparoscopic scissors.  An angiocatheter was then inserted in the right  upper quadrant under direct vision.  The cholangiocatheter was passed  through the angiocatheter and placed into the cystic duct.  A cholangiogram  was then performed under direct fluoroscopy with contrast. Contrast was seen  to flow easily in the entire biliary system and duodenum without evidence of  abnormality.  The cholangiocatheter was then removed.  The cystic duct was  then clipped three times proximally and completely transected.  The  gallbladder was then easily dissected free from the liver bed with the  electrocautery.  Hemostasis was achieved in the liver bed with the cautery.  Once the gallbladder was freed from the liver bed, it was pulled out through  the incision at the umbilicus.  The 0 Vicryl at the umbilicus was tied in  place closing the fascial defect.  Again the liver bed was examined and  hemostasis was achieved.  All ports were removed under direct vision and the  abdomen was deflated.  All incisions were anesthetized with 0.25% Marcaine  and then closed with 4-0 Monocryl subcuticular sutures.  Steri-Strips, gauze  and tape were then applied.  The patient tolerated the procedure well.  All  sponge, needle and instrument counts were correct at the end of the  procedure.  The patient was then extubated in the operating room and taken  in stable condition to the recovery room.                                                   Abigail Miyamoto, M.D.    DB/MEDQ  D:  08/02/2002  T:  08/02/2002  Job:  [id number]

## 2010-07-05 NOTE — Discharge Summary (Signed)
[name], [name]              ACCOUNT NO.:  [id number]   MEDICAL RECORD NO.:  [id number]          PATIENT TYPE:  INP   LOCATION:  4705                         [facility]AC[facility]  [facility]   PHYSICIAN:  [name], Kim MckeeDATE OF BIRTH:  03-18-1924   DATE OF ADMISSION:  10/28/2007  DATE OF DISCHARGE:  10/30/2007                               DISCHARGE SUMMARY   DISCHARGE DIAGNOSES:  1. Back pain, thought to be anginal equivalent but negative for      angina.      a.     Negative myocardial infarction.  2. Coronary artery disease with history of stent to the left anterior      descending in May 2007, now with cardiac catheterization revealing      only 30% in-stent restenosis within the left anterior descending      stent.  Left main was normal, left circumflex small and fairly      normal, and right coronary with 20% minimal stenosis.  Left      ventricle was normal with mid cavity obliteration.  3. Paroxysmal atrial fibrillation/sick sinus syndrome with history of      Guidant pacemaker in March 2007.  4. History of deep venous thrombosis in the left upper extremity after      pacer implant.  It had been treated with Coumadin but now only      aspirin and Plavix.  5. Renal insufficiency.  6. History of transient ischemic attack.  7. Chronic left bundle-branch block.  8. History of gastroesophageal reflux.  9. History of statin intolerance.   CONDITION ON DISCHARGE:  Improved.   PROCEDURE:  Combined left heart cath, October 29, 2007 by Dr. Nanetta Batty.   DISCHARGE MEDICATIONS:  1. Plavix 75 mg one daily.  2. Baby aspirin 81 mg daily.  3. Metoprolol 25 mg twice a day.  4. Lanoxin 0.125 mg daily.  5. Allegra 180 mg daily.  6. Welchol 625 mg 3 tablets twice a day.  7. Boost supplement daily.  8. Prilosec 20 mg 1 daily over-the-counter.   DISCHARGE INSTRUCTIONS:  1. Low-sodium heart-healthy diet.  2. Wash cath site with soap and water.  3. Call if any bleeding,  swelling, or drainage.  4. Increase activity slowly.  May shower or bathe.  No lifting for 2      days.  No driving for 3 days.  5. Follow up with Dr. Alanda Mckee in 2-3 weeks.  Office will call you      with date and time.   HISTORY OF PRESENT ILLNESS:  An 77 year old female followed by Dr.  Alanda Mckee for cardiology and Dr. [name] for primary care with a  history of coronary disease with drug-eluting stent placed to her LAD in  May 2007.  She had recurrent chest pain in June 2007, re-cathed, and the  site was patent.  Since that time, she has had several admissions for  back pain.  She feels the pain is similar to her pain that she had prior  to her intervention to the LAD.  Persantine Myoview per the patient was  normal  with 3 months prior to this admission, but she came to [facility] in  October 28, 2007 with mid sternal back pain.  It usually awakens her  around 3 a.m.  No appreciable difference with nitroglycerin sublingual.  No associated symptoms with nausea, vomiting, diaphoresis, and shortness  of breath.  The pain itself is not worsened by movement.  She has felt  terrible with it.  On the day of admission, she was awakened again at 3  a.m. and went to the emergency room in [place].  Negative troponins and  EKG was a paced rhythm but Dr. Yetta Flock felt they had been dealing with  this for some time and felt cardiac evaluation was warranted.  She was  then transferred to [facility] and admitted.   PAST MEDICAL HISTORY:  Please see problem list.   PAST SURGICAL HISTORY:  Laparoscopic cholecystectomy in June 2007 and  total hip replacement in December 2006.   ALLERGIES:  Intolerant to CODEINE and STATINS.   FAMILY HISTORY/SOCIAL HISTORY/REVIEW OF SYSTEMS:  See H&P.   PHYSICAL EXAMINATION ON DISCHARGE:  VITAL SIGNS:  Blood pressure 139/65,  pulse 69.  SKIN:  Right groin cath site was stable.  No hematoma.  No ecchymosis.  HEART:  S1 and S2.  Regular rate and rhythm pacing.    LABORATORY DATA:  Hemoglobin 13.7, hematocrit 40.3, WBC 5.6, platelets  185, neutrophils 59, lymphs 32, mono 7, eos 2, and baso 1.  Sed rate was  0 and this remained stable.  Protime 13.5, INR 1.  On admission, sodium  140, potassium 3.8, chloride 105, CO2 29, glucose 84, BUN 28, creatinine  1.22 on admission.  At discharge, sodium 114, potassium 5.2, chloride  106, CO2 28, glucose 84, BUN 31, and creatinine 1.26, stable.  Total  protein 5.9, albumin 3.6, AST 27, ALT 19, ALP 32, total bili 1.3, and  magnesium 2.2.  Cardiac enzymes were negative.  CK 58 and 65.  MB of 2  and 1.6.  Troponin I 0.01 to less than 0.01.  TSH 3.27.  UA small amount  of leukocyte esterase, otherwise clear.  EKG:  She was AV pacing on  admission.  Chest x-ray had been done in [place] prior to arrival.   HOSPITAL COURSE:  The patient was admitted by Dr. Alanda Mckee secondary to  ongoing back pain, thought to be anginal in nature.  She was admitted to  telemetry bed, monitored, and the next day underwent cardiac  catheterization.  Cardiac cath with results as previously stated.  By  the next day, she was stable and ready for discharge home.   Nutrition consult was obtained because her BMI was less than 19.  It was  recommended that she have Boost to help improve her nutritional plans.   She was seen and examined by Dr. Allyson Sabal and will follow up as an  outpatient      Darcella Gasman. [place], N.P.      [name], M.D.  Electronically Signed    LRI/MEDQ  D:  12/08/2007  T:  12/09/2007  Job:  [id number]   cc:   [name], M.D.

## 2010-07-05 NOTE — Discharge Summary (Signed)
[name], [name]              ACCOUNT NO.:  [id number]   MEDICAL RECORD NO.:  [id number]          PATIENT TYPE:  INP   LOCATION:  2023                         FACILITY:  [facility]   PHYSICIAN:  Richard A. Alanda Amass, M.D.DATE OF BIRTH:  1924-03-02   DATE OF ADMISSION:  07/24/2005  DATE OF DISCHARGE:  08/02/2005                                 DISCHARGE SUMMARY   DISCHARGE DIAGNOSES:  1.  Chest pain - questionable anginal equivalent status post catheterization      during this admission by Dr. Tresa Endo.  No in-stent restenosis in the      previously stented region of the left anterior descending.  2.  Known coronary artery disease with prior interventions.  3.  Paroxysmal atrial fibrillation with sick sinus syndrome status post      pacer implant, on Coumadin as outpatient.  4.  History of left upper extremity deep venous thrombosis.  5.  History of left total hip replacement.  6.  Chronic renal insufficiency.   HOSPITAL PROCEDURES:  Cardiac catheterization performed by Dr. Tresa Endo on July 28, 2005, revealed just minor in-stent restenosis, 30-40% of the LAD.  All  other arteries essentially without significant coronary disease.  EF greater  than 60%.   HOSPITAL CONSULTS:  We asked Dr. Ophelia Mckee, Ortho, to consult the patient for  left leg pain, left groin pain, and hip pain, and currently patient did not  have any problems with the hip replacement.  Both extremities were equal in  length.  No disfigurement or misalignment noticed.  Decision was made to  continue her ambulation and regular activities, and if any problems appear,  then contact Dr. Ophelia Mckee' office as outpatient.   HPI/HOSPITAL COURSE:  This is a 77 year old Caucasian female with prior  history of coronary disease who presented to the emergency room with  complaints of upper back pain in between shoulder blades, the same pain she  experienced prior to having previous procedure.  During that intervention in  May, she had a  Liberte stent placed in the LAD, which was non-drug-eluting  stent, and for a month was supposed to be on Plavix, so she came to the  hospital on Plavix, Coumadin and aspirin.  We stopped her Coumadin, and when  INR allowed she underwent cardiac catheterization, and it was on July 28, 2005.  For the full description of the catheterization, please refer to  dictated note by Dr. Tresa Endo.   The patient complained of left leg pain and bruising.  We checked a D-dimer  in case she may be having a DVT given her history of left upper extremity  clot, and D-dimer was less than 0.39, within normal limits.   We loaded her with Coumadin, and her INR gradually increased.  The INR on  day of discharge was 1.7.  The patient was given a dose of Lovenox prior to  discharge and advised to take 5 mg of Coumadin today, the day of discharge  and tomorrow, on Sunday, and have INR checked on Monday, August 04, 2005.   DISCHARGE LABS:  Her hemoglobin was 14.4, hematocrit 41.2.  Sodium 143,  potassium 4.0, BUN 28, creatinine 1.5, glucose 101.  INR 1.7.   DISCHARGE MEDICATIONS:  1.  Aspirin 81 mg daily.  2.  Prilosec 40 mg daily to b.i.d.  3.  Lopressor 100 mg b.i.d.  4.  Norvasc 5 mg daily.  5.  Allegra 180 mg daily.  6.  Coumadin 5 mg daily for these 2 days, and then office will contact the      patient to adjust the dose.  7.  Lanoxin 0.125 mg daily.   DISCHARGE DIET:  Low-salt, low-fat diet.   DISCHARGE INSTRUCTIONS:  Discontinue Plavix since it has been 1 month after  patient had Liberte stent in the LAD.  She will also have blood work, pro  time, and INR on August 04, 2005.   DISCHARGE FOLLOWUP:  Dr. Kandis Cocking service will contact the patient to  schedule an appointment.      [name], P.A.      Richard A. Alanda Amass, M.D.  Electronically Signed    MK/MEDQ  D:  08/02/2005  T:  08/02/2005  Job:  [id number]   cc:   Kim Fells. Ophelia Mckee, M.D.  Fax: [phone]   [name], MD

## 2010-07-05 NOTE — Cardiovascular Report (Signed)
NAMEDULA, HAVLIK              ACCOUNT NO.:  [id number]   MEDICAL RECORD NO.:  [id number]          PATIENT TYPE:  INP   LOCATION:  6525                         FACILITY:  [facility]   PHYSICIAN:  Darlin Priestly, MD  DATE OF BIRTH:  May 29, 1924   DATE OF PROCEDURE:  06/30/2005  DATE OF DISCHARGE:                              CARDIAC CATHETERIZATION   PROCEDURES:  1.  Left heart catheterization.  2.  Coronary angiography.  3.  Left ventriculogram.  4.  Ascending aortography.  5.  LAD-mid.  6.  Placement of intracoronary stent.   ATTENDING PHYSICIAN:  Darlin Priestly, MD   COMPLICATIONS:  None.   INDICATION:  Ms. Barich is a 77 year old female, patient of Dr. Susa Griffins and Dr. Yetta Flock in [place], with a history of atrial fibrillation  with sick sinus syndrome, status post dual-chamber pacer implant in March  2007. She does have a history of DVT in the left upper extremity, status  post pacemaker, and is on chronic Coumadin. She has a history of CVA,  chronic renal insufficiency, and left bundle branch block. She was admitted  on Jun 28, 2005, with a complaint of back pain felt to be an anginal  equivalent. She is now referred for cardiac catheterization to rule out  significant CAD.   PROCEDURE NOTE:  After giving informed consent the patient was brought to  the cardiac cath lab where the right and left groin were shaved, prepped,  and draped in the usual sterile fashion. ACT monitoring was established.  Using the modified Seldinger technique, a #6-French arterial sheath was  inserted in the right femoral artery. A #6-French diagnostic catheter was  used to perform diagnostic angiography.   The left main is a large vessel which gives rise to the LAD. The circumflex  is anomalous. The left main has a mild 20% proximal narrowing.   The LAD is a medium size vessel which courses the apex and the first  diagonal branch. There is an 80% lesion in the mid portion of the  LAD at the  takeoff of the first diagonal.   The first diagonal is a medium size which bifurcates distally with no  significant disease.   The left circumflex is anomalous origin off the right coronary cusp. There  is 50% ostial narrowing. It does give rise to one obtuse marginal with no  significant disease.   The right coronary artery is a large vessel which is dominant and consist of  the PDA and posterolateral branch. There is 30% narrowing in the distal RCA.  The PD and posterolateral branch have no significant disease.   LEFT VENTRICULOGRAM:  The left ventriculogram reveals a preserved EF of 50%  with inferior and apical hypokinesis probably secondary to pacer artifact.   HEMODYNAMICS:  Systemic arterial pressure 162/73, LV systolic pressure  162/10, LVEDP of 11.   Ascending aortography reveals no evidence of aortic dissection.   INTERVENTIONAL PROCEDURE:  LAD-mid: Following diagnostic angiography a 0.014  Forte __________ was advanced through the guiding catheter and positioned in  the distal LAD without difficulty. Next, a Liberte' non-drug  eluting stent  was then advanced into the mid LAD across the stenotic lesion. The stent was  then deployed for 12 to 24 seconds. The second inflation to 14 atmospheres  was performed for a total of 19 seconds. Follow-up angiogram revealed  excellent angiogram with no evidence of dissection or thrombus, though the  distal mid portion of the stent appeared to be somewhat under expanded. We  then exchanged this balloon for a Power Stealth 3.0 x 8-mm balloon which was  positioned in the distal mid portion of the stent. One inflation to 14  atmospheres was performed for a total of 23 seconds. Follow-up angiogram  revealed no evidence of dissection or thrombus with TIMI-III flow to the  distal vessels. IV Angiomax was used throughout the case.   Final aortograph monitoring reveals less than 10% residual stenosis in the  mid LAD stenotic  lesion with TIMI-III flow to the distal vessels. At this  point we elected to concluded the procedure. All balloons, and catheters  were removed. __________ sheaths were sewn in place and the patient was  transferred back to the ward in stable condition.   CONCLUSION:  1.  Successful placement of a Liberte' 2.75 x 20-mm stent (non-DES) in the      mid LAD stenotic. This was ultimately post dilated to 3.18 mm.  2.  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  3.  Normal left ventricular systolic function with wall motion abnormalities      as noted above.  4.  No evidence of aortic dissection.  5.  Systemic hypertension.  6.  Adjuvant use of Angiomax infusion.      Darlin Priestly, MD  Electronically Signed     RHM/MEDQ  D:  06/30/2005  T:  06/30/2005  Job:  [id number]   cc:   Gerlene Burdock A. Alanda Amass, M.D.  Fax: [phone]   Dr. Shanon Rosser, [place]

## 2010-07-05 NOTE — Cardiovascular Report (Signed)
NAMESHEREL, Kim NO.:  [id number]   MEDICAL RECORD NO.:  [id number]          PATIENT TYPE:  INP   LOCATION:  2023                         FACILITY:  [facility]   PHYSICIAN:  Nicki Guadalajara, M.D.     DATE OF BIRTH:  Jan 17, 1925   DATE OF PROCEDURE:  07/28/2005  DATE OF DISCHARGE:                              CARDIAC CATHETERIZATION   INDICATIONS:  Ms. [name] is a 77 year old female, patient of Dr.  Alanda Amass and Dr. Yetta Flock, who has a history of paroxysmal atrial  fibrillation with sick sinus no has a history of atrial fibrillation with  sick sinus syndrome, and is status post dual-chamber pacemaker implantation  in March 2007.  She has a history of DVT in the left upper extremity, status  post pacemaker and has been on chronic Coumadin.  She also has a history of  remote CVA, chronic renal insufficiency, left bundle branch block.  On Jun 30, 2005, she underwent cardiac catheterization after being admitted with  back discomfort felt to be an anginal equivalent.  She was found to have  high-grade 80% stenosis in the mid portion of the LAD after a takeoff of the  first diagonal vessel.  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  There was 30% narrowing in the distal RCA.  She underwent  stenting of the mid LAD with a non-DES 2.75 x 20 mm Liberte stent.  Study  was done with adjuvant Angiomax.  She initially had done well.  Apparently,  she had developed some of vague recurrent back discomfort, leading to  readmission to [facility] on July 24, 2005.  INR at that time was  subtherapeutic at 1.7.  She was seen by Dr. Jenne Campus who felt repeat  catheterization would be necessary.  She is now referred for catheterization  study.   PROCEDURE:  After premedication with Valium 3 mg intravenously, the patient  prepped and draped in the usual fashion.  Her right femoral  artery was  punctured anteriorly, and a 5-French arterial sheath was inserted without  difficulty.  Diagnostic catheterization was done with 5-French Judkins for  left and right coronary catheters.  A 5-French pigtail catheter was used for  RAO ventriculography.  The patient tolerated procedure well.   HEMODYNAMIC DATA:  Central aortic pressure was 147/63.  Left ventricular  pressure was 147/6, post A-wave 13.   ANGIOGRAPHIC DATA:  Left main coronary artery gave rise essentially gave  rise to the LAD system since the circumflex arose anomalously from the right  coronary cusp.  There was mild 20% narrowing in the proximal LAD before the  septal perforating artery.  After a moderate size first diagonal vessel, the  previously placed Liberty stent was present beyond just beyond the diagonal  takeoff and extended into the mid LAD.  The proximal portion of this stent  was widely patent.  In the distal third of the stent, there was mild intimal  narrowing of 30 to less than 40%.  There was brisk  TIMI III flow to the  distal LAD.  There was no evidence for dissection.   The left circumflex coronary artery arose from the right coronary cusp and  had a 40% ostial stenosis.   The right coronary artery arose just above the left circumflex takeoff and  was a large dominant vessel that gave rise to a large PDA inferiorly  branching and in a large posterolateral vessel.  The RCA was free of  significant disease.   Biplane selective ventriculography revealed ejection fraction greater than  60%.  There was a suggestion of mid cavity near obliteration and  hypercontractility with possible relative apical inferior hypocontractility  seen previously, felt to be due to the prior pacemaker implantation.  There  was evidence for mild angiographic mitral valve prolapse.  Also  intermittently, there was evidence for focal outpouching seen during  diastole in the basal inferior segment.   IMPRESSION:  1.   Normal LV function with ejection fraction at least 60% with suggestion      of near mid cavity obliteration, mild angiographic mitral valve      prolapse, and suggestion of focal possible outpouching in the inferior      posterior region.  2.  Mild 20% narrowing in the proximal LAD and evidence for predominantly      widely patent Liberte stent in the mid LAD after the diagonal takeoff      with evidence for a smooth 30-40% narrowing in the mid to distal aspect      of the stented segment without evidence for dissection and with TIMI III      flow.  3.  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  4.  Dominant right coronary artery.   RECOMMENDATIONS:  Medical therapy.  Two-D echo Doppler study will also be  done in light of the left ventriculography findings for further valuation.  The patient will also be restarted on Coumadin therapy.           ______________________________  Nicki Guadalajara, M.D.     TK/MEDQ  D:  07/28/2005  T:  07/28/2005  Job:  [id number]   cc:   Jenne Campus, M.D.   Richard A. Alanda Amass, M.D.  Fax: [phone]   Yetta Flock, M.D.

## 2010-07-05 NOTE — H&P (Signed)
[name], CROAK              ACCOUNT NO.:  [id number]   MEDICAL RECORD NO.:  [id number]          PATIENT TYPE:  INP   LOCATION:  3710                         FACILITY:  [facility]   PHYSICIAN:  Dani Gobble, MD       DATE OF BIRTH:  1925/02/11   DATE OF ADMISSION:  06/28/2005  DATE OF DISCHARGE:                                HISTORY & PHYSICAL   CHIEF COMPLAINT:  Chest pain.   HISTORY OF PRESENT ILLNESS:  77 year old white frail female with recent  hospitalization for severe bradycardia and syncope resulting in a temporary  pacer in [place] until she could get to [facility] and then she  underwent a DDDR [employer] model number 1297, serial number [id number], by  Dr. Alanda Amass.  That was complicated by a pneumothorax requiring chest tube  placement and significant debilitation.  She also developed a DVT in the  left upper extremity and is on Coumadin, in addition to some paroxysmal  atrial fibrillation that she is on Coumadin.  Other history includes CVA,  mitral valve prolapse, chronic renal insufficiency, and left bundle branch  block.  She also has a small sacral ulcer.  She left the hospital on May 20, 2005, and was admitted to [facility] just for rehab and had  since returned home and has done relatively well.  She was seen by Dr.  Alanda Amass Jun 17, 2005, and the pacemaker was interrogated and performing  correctly.   She first got up to go to the bathroom at 3 this morning, she thinks she  dozed back off and she developed back pain that was pretty significant  radiating across her back.  Normally she has reflux that will be in one spot  in her back, but this actually radiated across her back.  It was pretty  significant.  She also had some headache.  She went onto the emergency room  and was given Metoprolol, Lovenox, and aspirin.  She did have some inferior  ST changes on the EKG and her regular intrinsic rhythm sinus.  She was  transferred here to  [facility] and is currently without pain and no  specific complaints.   PAST MEDICAL HISTORY:  As stated in addition to reflux.  Her last Cardiolite  study was in August 2003, her EF was 86%.  There was anterior wall thinning  maybe secondary to breast attenuation, but may be subtle wall anterior  ischemia.  The patient was asymptomatic at that point.  She had had some  chest pressure prior to that.   OUTPATIENT MEDICATIONS:  Allegra 180 mg daily, Lanoxin 0.125 mg daily,  Prilosec 20 b.i.d., multi-vitamin daily, Lopressor 50, 1 1/2 tabs b.i.d.,  there is a question if she is taking that versus 50 twice a day, I will  verify, aspirin 81 mg daily, Coumadin 3 mg daily, Caltrate 600 plus D  b.i.d., Tessalon pearls p.r.n. for cough, Imodium as needed for diarrhea.   She also had a Cardiolite study done in December 2006 that was also negative  for ischemia.   ALLERGIES:  CODEINE  and intolerant to STATINS.   FAMILY HISTORY:  Mother died of a massive stroke, but she did have  hypertension.  Father died of cancer.  One brother is 23 years younger than  she is, has had a history of bypass grafting.   SOCIAL HISTORY:  She is married, unfortunately, her husband has cancer and  is in a nursing home.  They were surprised that he has actually survived as  long as he has.  She has one son in [place] and one son recently had surgery.  She has two children, two grandchildren, and two great grandchildren.  She  does not smoke, exercise, or use alcohol.  She uses a cane to ambulate.   REVIEW OF SYSTEMS:  Negative for light headedness, though sometimes she is  dizzy when she first gets up in  the morning.  In general, no specific  weight loss, no colds.  HEENT:  Occasional headache.  Cardiovascular:  Pain  as described.  Pulmonary negative for shortness of breath.  GI:  Occasional  diarrhea she thinks is related to reflux, she does have a history of reflux  which is described as a stabbing pain  in one spot in the middle of her back.  Musculoskeletal:  She has been debilitated and has been using a cane at  home.  GU:  No dysuria or hematuria.  Recent DVT on Coumadin.   LABORATORY DATA:  Dig 1.06.  Cardiac enzymes have been negative, cardiac  markers at [facility].  Hemoglobin 13.6, hematocrit 39.  INR 1.6.  Sodium 143,  potassium 4, BUN 27, creatinine 1.2, glucose 82, calcium 9.8.  At [facility], she had a brain CT that was negative.  Chest CT showed mild  cardiomegaly, small pericardial effusion, no pulmonary emboli, no aortic  dissection.  EKG atrial paced, left bundle branch block, the initial one.  Repeat ventricular paced with ST depression.   PHYSICAL EXAMINATION:  VITAL SIGNS:  Blood pressure 134/97, pulse 76, respirations 20, oxygen  saturation 100%, temperature 96.1.  GENERAL:  Alert and oriented white female, answers questions appropriately.  SKIN:  Warm and dry.  Brisk capillary refill.  HEENT:  Sclerae clear.  NECK:  Supple, no JVD, no bruits.  HEART:  Normal S1 and S2, regular rate and rhythm, 2/6 systolic murmur.  ABDOMEN:  Benign, positive bowel sounds.  EXTREMITIES:  No edema.  Pulses intact.  LUNGS:  A few crackles in the left base.   Patient meds given at [facility], aspirin 325, Lovenox 45 mg subcu,  Metoprolol 5 IV x 3.   ASSESSMENT:  1.  Back pain, rule out cardiac versus musculoskeletal versus reflux.  2.  Recent complete heart block with syncope now with permanent pacemaker.  3.  Depression.  4.  Paroxysmal atrial fibrillation.  5.  Remote stroke.  6.  Mitral valve prolapse.  7.  DVT of the left upper extremity, recent Dopplers in our office, I do not      have those results.  8.  Chronic renal insufficiency.  9.  Abnormal EKG.   PLAN:  Admit to rule out MI, will do serial enzymes.  Continue home meds  except for the Coumadin.  Consider cath versus Cardiolite depending on how her enzymes come back tomorrow.  Dr. Domingo Sep saw her and  assessed her with  me.      Darcella Gasman. Annie Paras, N.P.    ______________________________  Dani Gobble, MD    LRI/MEDQ  D:  06/28/2005  T:  06/28/2005  Job:  [id number]   cc:   Dr. Yetta Flock

## 2010-07-05 NOTE — Discharge Summary (Signed)
NAMEALI, MCLAURIN              ACCOUNT NO.:  [id number]   MEDICAL RECORD NO.:  [id number]          PATIENT TYPE:  INP   LOCATION:  4705                         FACILITY:  [facility]   PHYSICIAN:  Richard A. Alanda Amass, M.D.DATE OF BIRTH:  1924/07/28   DATE OF ADMISSION:  10/28/2007  DATE OF DISCHARGE:  10/30/2007                               DISCHARGE SUMMARY   DISCHARGE DIAGNOSES:  1. Back pain.    Dictation ended at this point.      Darcella Gasman. [place], N.P.      Richard A. Alanda Amass, M.D.  Electronically Signed    LRI/MEDQ  D:  12/03/2007  T:  12/04/2007  Job:  [id number]

## 2010-07-05 NOTE — Op Note (Signed)
NAME:  AYAANA, BIONDO                        ACCOUNT NO.:  [id number]   MEDICAL RECORD NO.:  [id number]                   PATIENT TYPE:  AMB   LOCATION:  ENDO                                 FACILITY:  [facility]   PHYSICIAN:  Anselmo Rod, M.D.               DATE OF BIRTH:  04-30-1924   DATE OF PROCEDURE:  12/28/2002  DATE OF DISCHARGE:                                 OPERATIVE REPORT   PROCEDURE PERFORMED:  Esophagogastroduodenoscopy with biopsies.   ENDOSCOPIST:  Charna Elizabeth, M.D.   INSTRUMENT USED:  Olympus video panendoscope.   INDICATIONS FOR PROCEDURE:  Epigastric pain in spite of proton pump  inhibitor used in a 77 year old white female rule out peptic ulcer disease,  esophagitis, gastritis, etc.   PREPROCEDURE PREPARATION:  Informed consent was procured from the patient.  The patient was fasted for eight hours prior to the procedure and was  advised to hold off her aspirin and Plavix for five days prior to the  procedure.   PREPROCEDURE PHYSICAL:  The patient had stable vital signs.  Neck supple,  chest clear to auscultation.  S1, S2 regular.  Abdomen soft with normal  bowel sounds.   DESCRIPTION OF PROCEDURE:  The patient was placed in the left lateral  decubitus position and sedated with 50 mg of Demerol and 5 mg of Versed  intravenously.  Once the patient was adequately sedated and maintained on  low-flow oxygen and continuous cardiac monitoring, the Olympus video  panendoscope was advanced through the mouth piece over the tongue into the  esophagus under direct vision.  The entire esophagus appeared normal with no  evidence of ring, stricture, masses, esophagitis or Barrett's mucosa.  The  scope was then advanced to the stomach.  Antral gastritis was noted.  Biopsy  done to rule out presence of Helicobacter pylori by pathology.  Multiple  small sessile polyps were seen in the proximal portion of the stomach.  These were biopsied for pathology (three to four  polyps were biopsied).  Retroflexion in the high cardia revealed some small sessile polyps but no  other abnormalities were noted.   IMPRESSION:  1. Normal-appearing esophagus and proximal small bowel.  2. Antral gastritis, biopsy done for Helicobacter pylori.  3. Multiple small sessile polyps, biopsied from proximal stomach.  All the     polyps could not be biopsied.   RECOMMENDATIONS:  1. Await pathology results.  2.     Treat with antibiotics if Helicobacter pylori present on pathology.  3. Continue proton pump inhibitors.  4. Outpatient follow-up in the next two weeks for further recommendations.                                               Anselmo Rod, M.D.    JNM/MEDQ  D:  12/28/2002  T:  12/29/2002  Job:  [id number]   cc:   Dr. [name].

## 2010-07-05 NOTE — Discharge Summary (Signed)
[name], BARBARA              ACCOUNT NO.:  [id number]   MEDICAL RECORD NO.:  [id number]          PATIENT TYPE:  INP   LOCATION:  2040                         FACILITY:  [facility]   PHYSICIAN:  Richard A. Alanda Amass, M.D.DATE OF BIRTH:  [birth year]   DATE OF ADMISSION:  11/26/2005  DATE OF DISCHARGE:  11/27/2005                                 DISCHARGE SUMMARY   DISCHARGE DIAGNOSES:  1. Left shoulder pain, myocardial infarction ruled out this admission.  2. Known coronary disease with previous non-drug-eluting stent stenting on      Jun 30, 2005 with relook catheterization in June 2007 showing no      restenosis.  3. History of paroxysmal atrial fibrillation and sick sinus syndrome.  She      is status post Guidant pacemaker and is on chronic Coumadin.  4. History of deep venous thrombosis.  5. Mild renal insufficiency.  6. Remote cerebrovascular accident.  7. Normal left ventricular function.   HOSPITAL COURSE:  Ms. [name] is a 77 year old female followed by Dr. Yetta Flock  and Dr. Alanda Amass who came to the emergency room via EMS for chest pain.  She has a history of non-DES stent to her mid LAD in May 2007.  She had a  relook on July 28, 2005 which showed a 30% narrowing in the distal portion  of the stent.  The patient has complained of fatigue for about a month and  then she developed some left shoulder pain and came to the emergency room.  She did try nitroglycerin at home, but this did not alleviate her symptoms.  She was seen by Dr. Jenne Campus on admission.  She was admitted to telemetry.  Her INR was 2.3.  Enzymes were negative.  She did have leukocyte esterase  and bacteria in her urine, and a urine culture is pending.  We did started  her on Cipro empirically.  She was not febrile.  We feel she can be  discharged on November 27, 2005.  She will have her pacemaker interrogated  before discharge per Dr. Mikey Bussing request.  Dr. Alanda Amass will see her in  followup in a few  weeks.   DISCHARGE MEDICATIONS:  1. Coated aspirin 81 mg a day.  2. Coumadin 5 mg a day with 2.5 mg on Tuesdays and Fridays.  3. Nexium 40 mg a day.  4. Lanoxin 0.125 mg a day.  5. Metoprolol 100 mg twice a day.  6. Cipro 250 mg twice a day for a 3-day course.   EKG shows a paced rhythm.   DISPOSITION:  The patient discharged in stable condition and will follow up  with Dr. Alanda Amass.      Abelino Derrick, P.A.      Richard A. Alanda Amass, M.D.  Electronically Signed    LKK/MEDQ  D:  11/27/2005  T:  11/28/2005  Job:  [id number]   cc:   [name], Dr.

## 2010-07-05 NOTE — Discharge Summary (Signed)
[name], [name]              ACCOUNT NO.:  [id number]   MEDICAL RECORD NO.:  [id number]          PATIENT TYPE:  INP   LOCATION:  6525                         FACILITY:  [facility]   PHYSICIAN:  Richard A. Alanda Amass, M.D.DATE OF BIRTH:  [birth year]   DATE OF ADMISSION:  06/28/2005  DATE OF DISCHARGE:  07/04/2005                                 DISCHARGE SUMMARY   DISCHARGE DIAGNOSIS:  1.  Unstable angina, left anterior descending stent this admission with a      Liberte monorail stent.  2.  Good left ventricular function.  3.  History of paroxysmal atrial fibrillation and sick sinus syndrome status      post permanent pacemaker.  4.  History of deep venous thrombosis left lower extremity.  5.  Coumadin therapy.  6.  Left bundle branch block.  7.  History of chronic renal insufficiency with creatinine of 1.4 at      discharge.   HOSPITAL COURSE:  Patient is 77 year old female followed by Dr. Alanda Amass  and Dr. Yetta Flock.  She has a history of sick sinus syndrome and had a  pacemaker implanted by Dr. Alanda Amass May 05, 2005.  This was complicated  by a pneumothorax which required a chest tube.  She was discharged May 20, 2005.  She did have a DVT during her hospitalization and is on Coumadin.  She was admitted with chest pain by Dr. Domingo Sep Jun 28, 2005.  This was  worrisome for unstable angina.  Patient was admitted to telemetry, started  on heparin as her INR was 1.8.  Patient did have some EKG changes and it was  decided to proceed with diagnostic catheterization.  This was done Jun 30, 2005 by Dr. Jenne Campus.  Patient had an 80% mid LAD.  The site was stented with  a Liberte stent.  She had good results.  Plan is for Plavix for one month  and then will discontinue this.  She was admitted to the telemetry floor,  put back on IV heparin and Coumadin was resumed.  Her INR is 2.1 on the 18th  and we feel she can be discharged.   DISCHARGE MEDICATIONS:  1.  Aspirin 81 mg a day.  2.   Allegra 180 mg a day.  3.  Lanoxin 0.125 mg a day.  4.  Prilosec 20 mg a day.  5.  Metoprolol 100 mg b.i.d.  6.  Coumadin 3 mg a day.  7.  Norvasc 5 mg a day.   LABORATORIES AT DISCHARGE:  Her INR is 2.1.  Sodium 143, potassium 3.9, BUN  18, creatinine 1.4.  White count 5.3, hemoglobin 13.2, hematocrit 38.8,  platelets 164.  One troponin was slightly positive 0.06.  CK-MBs were  negative.  TSH 1.69.  Digoxin level 0.8.  Telemetry shows atrial pacing.   DISPOSITION:  Patient discharged stable condition.  She did complain of some  urinary frequency.  We obtained a urine culture and this will be followed up  as an outpatient.  Will have Dr. Alanda Amass see her in a couple weeks in the  office.  She will need  a pro time in a week.      Abelino Derrick, P.A.      Richard A. Alanda Amass, M.D.  Electronically Signed    LKK/MEDQ  D:  07/04/2005  T:  07/04/2005  Job:  [id number]   cc:   [name], M.D.  [place]

## 2010-07-05 NOTE — Op Note (Signed)
NAME:  [name], [name]                        ACCOUNT NO.:  [id number]   MEDICAL RECORD NO.:  [id number]                   PATIENT TYPE:  AMB   LOCATION:  ENDO                                 FACILITY:  [facility]   PHYSICIAN:  Anselmo Rod, M.D.               DATE OF BIRTH:  11-05-1924   DATE OF PROCEDURE:  07/15/2002  DATE OF DISCHARGE:                                 OPERATIVE REPORT   PROCEDURE PERFORMED:  Screening colonoscopy.   ENDOSCOPIST:  Anselmo Rod, M.D.   INSTRUMENT USED:  Olympus video colonoscope.   INDICATIONS FOR PROCEDURE:  Rectal bleeding in a 77 year old white female.  Rule out colonic polyps, masses, hemorrhoids, etc.   PREPROCEDURE PREPARATION:  Informed consent was procured from the patient.  The patient was fasted for eight hours prior to the procedure and prepped  with a bottle of magnesium citrate and a gallon of GoLYTELY the night prior  to the procedure.   PREPROCEDURE PHYSICAL:  VITAL SIGNS:  The patient had stable vital signs.  NECK:  Supple.  CHEST:  Clear to auscultation.  S1 and S2 regular.  ABDOMEN:  Soft with normal bowel sounds.   DESCRIPTION OF THE PROCEDURE:  The patient was placed in the left lateral  decubitus position, sedated with 60 mg of Demerol and 6 mg of Versed  intravenously.  Once the patient was adequately sedated and maintained on  low-flow oxygen and continuous cardiac monitoring, the Olympus video  colonoscope was advanced from the rectum to the cecum with slight  difficulty. There was some residual stool in the colon.  Multiple washings  were done.  The procedure was complete up to the terminal ileum, which  appeared normal.  The appendicular orifice and ileocecal valve were clearly  visualized and photographed.  An isolated diverticulum was in the left  colon.  Small internal hemorrhoids were seen on retroflexion in the rectum.  The patient tolerated the procedure well without complications.   IMPRESSION:  1. No  masses or polyps present.  2. Isolated diverticulum in the left colon.  3. Small internal hemorrhoids seen on retroflexion in the rectum.  4. Otherwise normal colonoscopy up to the terminal ileum.   RECOMMENDATIONS:  1. Resume Plavix today.  2. A high-fiber with liberal fluid intake has been recommended.  3.     Outpatient followup in the next two weeks to further assess the patient's     symptoms and plan workup thereafter.  4. Repeat colorectal cancer screening in the next five years, unless the     patient develops any abnormal symptoms in the interim.                                               Anselmo Rod, M.D.  JNM/MEDQ  D:  07/15/2002  T:  07/16/2002  Job:  [id number]   cc:   Gerlene Burdock A. Alanda Amass, M.D.  [phone] [address], [address]  [place]  [place] [address]  Fax: [phone]   Debroah Loop, M.D.  [address] [place]  [place] [address]  Fax: [phone]

## 2010-07-05 NOTE — Op Note (Signed)
[name], [name]              ACCOUNT NO.:  [id number]   MEDICAL RECORD NO.:  [id number]          PATIENT TYPE:  INP   LOCATION:  5018                         FACILITY:  [facility]   PHYSICIAN:  Mark C. Ophelia Charter, M.D.    DATE OF BIRTH:  Feb 22, 1924   DATE OF PROCEDURE:  02/17/2005  DATE OF DISCHARGE:                                 OPERATIVE REPORT   PREOPERATIVE DIAGNOSIS:  Left femoral neck fracture.   POSTOPERATIVE DIAGNOSIS:  Left femoral neck fracture.   PROCEDURE:  Left hip cemented hemiarthroplasty.   SURGEON:  Mark C. Ophelia Charter, MD   ANESTHESIA:  GOT plus Marcaine to skin local.   ESTIMATED BLOOD LOSS:  100 mL.   DESCRIPTION OF PROCEDURE:  After induction of anesthesia, the patient was  placed in the lateral position with a __________ frame.  Preoperative Ancef  was given.  The hip was prepped with DuraPrep.  The usual total hip sheets,  drapes, appropriate stockinette, Coban, and benign Vi-Drape x2 was applied.  A posterior approach was made.  The gluteus maximus was split in line with  its fibers.  The piriformis was tagged and cut.  The patient was thin at 82  pounds.  The sciatic nerve was easily identified.  The posterior capsule was  opened.  The fracture showed no motion.  The neck was cut one fingerbreadth  above the lesser trochanter.  Next, after preparation with broaching, trials  of a #3, +0 neck restored leg length.  Pulsatile lavage, vacuum mixing of  cement, pressure gun placement, and then placement of prosthesis were  performed.  All excessive cement was removed.  The ball size was trialed,  and a 48 was an appropriate fit, a +0 neck, 48 ball, and reduction.  Closed  the tensor fascia with Ethibond.  The piriformis was repaired after the  capsule was repaired with #1 Ethibond.  The piriformis was repaired and the  gluteus medius fascia.  The tensor fascia was closed with 0 Ethibond, 0  Vicryl in the gluteus maximus fascia, 2-0 Vicryl in the subcutaneous  tissues, skin staple closure.  There was 10 mL of Marcaine infiltration in  the hip.  Postoperative dressing, knee immobilizer, and transferred to the  recovery room in stable condition.  Instrument count and needle count were  correct.      Mark C. Ophelia Charter, M.D.  Electronically Signed     MCY/MEDQ  D:  02/17/2005  T:  02/17/2005  Job:  [id number]

## 2010-07-05 NOTE — Op Note (Signed)
NAMEGERENE, NEDD              ACCOUNT NO.:  [id number]   MEDICAL RECORD NO.:  [id number]          PATIENT TYPE:  INP   LOCATION:  2909                         FACILITY:  [facility]   PHYSICIAN:  Richard A. Alanda Amass, M.D.DATE OF BIRTH:  1925/01/09   DATE OF PROCEDURE:  05/05/2005  DATE OF DISCHARGE:                                 OPERATIVE REPORT   PROCEDURE:  1.  Implantation of permanent DDDR Guidant Insignia 1+DR model number 1297;      SN [id number] pulse generator with in-line coaxial bipolar, steroid-eluting      Thin Line Guidant atrial and ventricular electrodes, passive fixation.  2.  Left upper extremity venogram.   ESTIMATED BLOOD LOSS:  Approximately 60 mL.   ANESTHESIA:  Five milligrams Valium p.o. premedication.   COMPLICATIONS:  None.   IMPLANTING PHYSICIAN:  Richard A. Alanda Amass, M.D.   SEDATIVE MEDICATION:  Versed total 4 milligrams IV in divided doses,  morphine 1 milligram IV, Zofran 4 milligrams IV for nausea.   Left upper extremity venogram done with 10 mL of contrast through a  peripheral venous line.   PREOPERATIVE DIAGNOSES:  1.  Recurrent syncope with documented complete heart block.  2.  Pacemaker dependent with temporary transvenous pacer implant at      [facility].  3.  Sick sinus syndrome, paroxysmal atrial flutter - supraventricular      tachycardia.  4.  Mitral valve prolapse.  5.  Systemic hypertension.  6.  Prior transient ischemic attack.  7.  Prior history of fall with hip fracture November 2007 associated with      syncope - unrecognized. Hemiarthroplasty Dr. Ophelia Charter December 18, 2005.  8.  Recurrent syncope with a temporary transvenous pacer implant, [facility], May 04, 2005.  9.  Hyperlipidemia.  10. Negative Cardiolite for ischemia.  11. Chronic left bundle branch block.   POSTOPERATIVE DIAGNOSES:  1.  Recurrent syncope with documented complete heart block.  2.  Pacemaker dependent with temporary transvenous pacer implant  at      [facility].  3.  Sick sinus syndrome, paroxysmal atrial flutter - supraventricular      tachycardia.  4.  Mitral valve prolapse.  5.  Systemic hypertension.  6.  Prior transient ischemic attack.  7.  Prior history of fall with hip fracture November 2007 associated with      syncope - unrecognized. Hemiarthroplasty Dr. Ophelia Charter December 18, 2005.  8.  Recurrent syncope with a temporary transvenous pacer implant, [facility], May 04, 2005.  9.  Hyperlipidemia.  10. Negative Cardiolite for ischemia.  11. Chronic left bundle branch block.   PROCEDURE:  This 77 year old white married great-grandmother of two is a  nonsmoker and a long-term patient of mine followed for mitral valve prolapse  and palpitations. History is as outlined above. She had a temporary  transvenous pacemaker placed via the right femoral vein and a hematoma right  antecubital fossa from a recent cutdown for TTVP. A left-sided approach was  felt best in this situation. The patient was pacemaker dependent and was in  atrial  flutter with paced ventricular response and was pacer dependent on  coming to the laboratory.   She was premedicated with 5 milligrams Valium p.o. and sedated in the  laboratory. 1% Xylocaine was used for local anesthesia. The left anterior  chest was prepped, draped in the usual manner. 1% Xylocaine was used. Left  infraclavicular curvilinear transverse incision was performed, brought down  the prepectoral fascia using blunt dissection. Electrocautery to control  hemostasis. The patient was quite thin with kyphoscoliosis. Several attempts  had at extrathoracic venous access under fluoroscopic control were  productive of venous return, however, we could not thread a guide wire  adequately in the extrathoracic subclavian vein. Left upper extremity  venography was then done with approximately 10 mL of dye followed by saline  bolus. 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 The subclavian vein was then quite  tortuous. No cephalic vein was visualized.   Under fluoroscopic control, we were then able to enter the subclavian vein  under the clavicle after several punctures with an 18 thin-wall needle using  fluoroscopic guidance. We were then able to thread a glide 0.035 wire and  insert a 8-French short peel-away Cook venous introducer. The atrial and  ventricular leads were then introduced in tandem with double-introducer  technique. The introducer was peeled away and a J-tipped stainless steel  guide wire retained. Ventricular electrode was positioned in the RV apex.  The atrial electrode in the right atrial appendage confirmed by fluoroscopy.  The leads were secured, however, fluoroscopy visualized a large loop in the  left subclavian vein and this had to be straightened out. For this reason,  we then had to cut the sutures securing the venous lead and the ventricular  lead. The ventricular lead was then straightened with the loop brought down  to the right atrial level. The atrial lead was secured to prevent any  dislodgement. The ventricular lead was in good position with an adequate  loop. The atrial lead was stable and threshold testing was performed again  on the ventricular electrode. Excellent acute ventricular bipolar and  unipolar thresholds were obtained. The electrodes were secured at the  insertion site with the previously placed #1 figure-of-eight silk suture to  prevent migration. The pocket was irrigated and 500 milligrams kanamycin  solution and the leads were further secured with two interrupted #1 silk  sutures around a silicone sleeve for each electrode. The sponge count was  correct. The temporary pacer was removed under fluoroscopic control without  difficulty. The generator was hooked to the electrodes in the proper AV sequence with two hex nuts tightened for each electrode,  delivered into the  pocket with the electrodes looped behind. It was loosely secured to the  underlying muscle and fascia. The subcutaneous tissue was closed with two  separate running layers of 2-0 Vicryl suture and the skin was closed with 4-  0 subcuticular Vicryl suture. Steri-Strips were applied. There was good  hemostasis at this point. Fluoroscopy showed good position of the atrial  ventricular electrodes.   Magnet rate at BOL equals 100, at EOL magnet rate drops to 85 per minute.   The patient was in atrial flutter but atrial pacing was performed.   Bipolar P-waves 3.0 mV, impedance 723 ohms, bipolar atrial threshold 0.5  volts, 0.5 milliseconds.   Unipolar P-waves 6.2 mV, impedance 495 ohms, threshold 0.4 volts at 0.5  milliseconds.   Bipolar R-waves and unipolar  R-waves not obtain since the patient was pacer  dependent throughout the procedure with temporary paced rhythm.   Bipolar ventricular threshold 0.3 volts at 0.5 milliseconds, impedance 649  ohms.   Unipolar ventricular threshold 0.3 volts at 0.5 milliseconds, impedance 546  ohms.   No diaphragmatic pacing at PSA output of 10 volts, unipolar, bipolar, atrial  or ventricular electrodes.      Richard A. Alanda Amass, M.D.  Electronically Signed     RAW/MEDQ  D:  05/05/2005  T:  05/06/2005  Job:  [id number]   cc:   CP Lab   Pacer Lab   [name], MD   Harl Bowie, M.D.  Fax: [phone]

## 2010-07-05 NOTE — Discharge Summary (Signed)
[name], [name]              ACCOUNT NO.:  [id number]   MEDICAL RECORD NO.:  [id number]          PATIENT TYPE:  INP   LOCATION:  5018                         FACILITY:  [facility]   PHYSICIAN:  Mark C. Ophelia Charter, M.D.    DATE OF BIRTH:  06/18/1924   DATE OF ADMISSION:  02/16/2005  DATE OF DISCHARGE:  02/21/2005                                 DISCHARGE SUMMARY   FINAL DIAGNOSIS:  Left femoral neck fracture.   ADDITIONAL DIAGNOSIS:  1.  Depression.  2.  Weight loss secondary to depression.   This 77 year old female well known to me fell several days ago and was  reported from [facility] that she had a right hip fracture.  On exam,  her left hip was painful.  CT was labeled right hip while x-ray was labeled  left hip.  On exam, it was her left hip that was fractured and repeat x-rays  confirmed this.  She was 5 feet 4 inches and weighed only 82 pounds.  She  states she has been depressed since her husband was at the [place] with a  diagnosis of colon cancer.   Admission labs showed a hemoglobin 11, hematocrit 32.1.  Protime and PTT  were normal.  Glucoses ran in the 120 to 125 range.  Creatinine was 1.3 with  a BUN of 11.  Total protein was 4.2 and albumin was low at 2.2.  Hemoglobin  A1C was 5.8.  Pre-albumin was low at 9.3.   HOSPITAL COURSE:  The patient was admitted and after informed consent,  underwent left hip monopolar hemiarthroplasty on February 17, 2005, under  general anesthesia plus Marcaine local.  Postoperative hemoglobin was 9.4,  INR 1.2, she was on PT/OT protocol, pharmacy Coumadin protocol.  She was  seen about home needs.  Creatinine was 1.4 and stable.  Albumin was low and  nutritional consultation was obtained.  She was placed on some Elavil, both  for depression and also help to encourage her to eat.  No one is home during  the day.  She was not a candidate for being discharged home.  She is ready  for rehab transfer and finally was transferred on February 21, 2005.  She was  placed on nutritional supplements in addition to the meals and watch with  the calorie count.  The Foley catheter was removed postop.  She was voiding  well on her own and she was transferred to rehab on February 21, 2005, with  iron 300 mg p.o. b.i.d., Colace 100 mg p.o. b.i.d., added to her  preadmission medications plus her pain medication.  Admission medications  were Metoprolol 50 mg b.i.d., omeprazol 200 mg b.i.d., Plavix 75 mg daily,  baby aspirin one a day, Caltrate D 600 mg daily, and multi-vitamin one a  day.  Added to this is the Colace, iron, and also Amitriptyline 25 mg p.o.  q.h.s.      Mark C. Ophelia Charter, M.D.  Electronically Signed     MCY/MEDQ  D:  04/01/2005  T:  04/01/2005  Job:  [id number]

## 2010-07-05 NOTE — Discharge Summary (Signed)
[name], [name]              ACCOUNT NO.:  [id number]   MEDICAL RECORD NO.:  [id number]          PATIENT TYPE:  IPS   LOCATION:  4002                         FACILITY:  [facility]   PHYSICIAN:  Erick Colace, M.D.DATE OF BIRTH:  April 27, 1924   DATE OF ADMISSION:  02/21/2005  DATE OF DISCHARGE:  03/03/2005                                 DISCHARGE SUMMARY   DISCHARGE DIAGNOSES:  1.  Left  femoral neck fracture with hemiarthroplasty.  2.  Confusion, resolved.  3.  Situation depression.  4.  Chronic atrial fibrillation.  5.  Acute villous anemia.   HISTORY OF PRESENT ILLNESS:  [name] is a 77 year old female with a  history of GERD and atrial fibrillation who fell a few days prior to  admission to [facility] on February 16, 2005. She was admitted via  outside hospital for left femoral neck fracture. She was evaluated by Dr.  Ophelia Charter and underwent a left hemiarthroplasty on February 17, 2005.  Postoperatively was weightbearing as tolerated and on Coumadin for deep  venous thrombosis prophylaxis. She was noted to have depressed mood and was  started on Elavil 25 mg for this. Therapy was initiated and the patient  required assist for ADLs, transfers, and mobility. Rehab was consulted for  further therapy.   PAST MEDICAL HISTORY:  1.  Mitral valve prolapse.  2.  Chronic atrial fibrillation.  3.  TIA.  4.  Cholecystectomy.  5.  D&C.  6.  Depression.  7.  Glucose intolerance.  8.  Chronic renal insufficiency.  9.  Tonsillectomy.  10. Gastritis.   ALLERGIES:  No known drug allergies. The patient is intolerant to MORPHINE,  STATINS, and CAINES.   FAMILY HISTORY:  Positive for coronary artery disease and cancer.   SOCIAL HISTORY:  The patient is married, residing in a one-level home with  ramp at entry. Husband currently receiving cancer treatment at the [facility]  hospital. She has a daughter-in-law and two children who live with them. She  does not use tobacco or  alcohol. The patient was independent and driving  prior to admission.   HOSPITAL COURSE:  [name] was admitted to rehab on February 21, 2005, for inpatient therapies to consist of PT/OT daily.  Post admission she  was maintained on Coumadin for DVT prophylaxis and this is to continue  through March 20, 2005, to complete her course of treatment. Initially the  patient was on Vicodin for pain control. However, the patient was noted to  have delirium and excessive confusion on this and therefore this was  discontinued. Labs were done post admission. The patient was noted to have  hemoglobin of 7.9 and she was transfused with 2 units packed red blood  cells. Recheck labs last on January 12th showed hemoglobin of 13.9,  hematocrit 40.7, white count 8.9, platelet count 532,000. Check of lytes  showed the patient to continue with her chronic renal insufficiency with  sodium  of 140, potassium 3.5, chloride 106, CO2 26, BUN 19, creatinine 1.3,  glucose 119. The patient was noted to have an episode of one  heme-positive  stool. CBC, however, revealed H&H to be stable.   The patient has had high levels of anxiety and was started on low-dose Xanax  0.25 mg b.i.d. to help assist with this. She has declined any treatment for  situational depression as she felt she would prefer to deal her husband's  illness independently. Once Vicodin was discontinued the patient's confusion  resolved. Currently the pain is managed with p.r.n. use of Ultram. As the  patient settled into a routine she was able to progress along with her  therapy. The patient made good progress during her stay. By the time of  discharge the patient was supervision for upper body care, supervision with  assistance for lower body care, supervision for toileting. She does require  supervision with verbal cues for kitchen tasks. In terms of mobility the  patient is modified independent for transfers, modified independent for   ambulating 150 feet with rolling walker . Requires a low supervision for  navigate six steps with cues for sequencing. The patient is discharged to  home with family to provide intermittent supervision. She will also continue  with further follow-up home health PT/OT by [facility] post  discharge. [facility] RN has been arranged to have protime with  [place] pharmacy to follow Coumadin through February 1st. On March 03, 2005, the patient is discharged to home.   DISCHARGE MEDICATIONS:  1.  Lopressor 50 mg b.i.d.  2.  Diltiazem CD 180 mg daily.  3.  Ferrous sulfate 325 mg daily.  4.  Coumadin 2.5 mg q.p.m.  5.  Coated aspirin 81 mg daily.  6.  Ultram 50 mg q.i.d. p.r.n. pain.  7.  Xanax 0.25 mg p.r.n. anxiety.  8.  Omeprazole 40 mg daily.  9.  Tylenol p.r.n. pain.   ACTIVITY:  As tolerated with left hip precautions, to use a walker.   DIET:  Regular.   WOUND CARE:  Wash with soap and water, keep clean and dry.   SPECIAL INSTRUCTIONS:  No alcohol, no driving. Resume Plavix after January  31st.   FOLLOWUP:  The patient is to follow up with Dr. Ophelia Charter for postoperative care  in two to three weeks; follow up with Dr. Jayme Cloud for routine check; follow  up with Dr. Wynn Banker as needed.      Greg Cutter, P.A.      Erick Colace, M.D.  Electronically Signed    PP/MEDQ  D:  03/03/2005  T:  03/03/2005  Job:  [id number]   cc:   [name] Ophelia Charter, M.D.  Fax: [phone]

## 2010-11-20 LAB — COMPREHENSIVE METABOLIC PANEL
BUN: 28 — ABNORMAL HIGH
Calcium: 9
Glucose, Bld: 84
Sodium: 140
Total Protein: 5.9 — ABNORMAL LOW

## 2010-11-20 LAB — GLUCOSE, CAPILLARY
Glucose-Capillary: 100 — ABNORMAL HIGH
Glucose-Capillary: 107 — ABNORMAL HIGH
Glucose-Capillary: 116 — ABNORMAL HIGH
Glucose-Capillary: 215 — ABNORMAL HIGH
Glucose-Capillary: 90

## 2010-11-20 LAB — BASIC METABOLIC PANEL
BUN: 30 — ABNORMAL HIGH
CO2: 28
Chloride: 105
Chloride: 106
Creatinine, Ser: 1.26 — ABNORMAL HIGH
GFR calc Af Amer: 49 — ABNORMAL LOW
Glucose, Bld: 138 — ABNORMAL HIGH
Potassium: 4.2

## 2010-11-20 LAB — CK TOTAL AND CKMB (NOT AT ARMC)
CK, MB: 2
Relative Index: INVALID
Relative Index: INVALID
Total CK: 58

## 2010-11-20 LAB — CBC
HCT: 39.3
HCT: 41.5
Hemoglobin: 13.3
MCHC: 33.9
MCHC: 34
MCV: 96
MCV: 96.2
Platelets: 189
RBC: 4.09
RBC: 4.19
RDW: 13.1
WBC: 5.5

## 2010-11-20 LAB — URINALYSIS, ROUTINE W REFLEX MICROSCOPIC
Bilirubin Urine: NEGATIVE
Hgb urine dipstick: NEGATIVE
Protein, ur: NEGATIVE
Urobilinogen, UA: 0.2

## 2010-11-20 LAB — DIFFERENTIAL
Lymphs Abs: 1.8
Monocytes Relative: 7
Neutro Abs: 3.3
Neutrophils Relative %: 59

## 2010-11-20 LAB — URINE MICROSCOPIC-ADD ON

## 2010-11-20 LAB — TSH: TSH: 3.27

## 2010-11-20 LAB — PROTIME-INR
INR: 1
Prothrombin Time: 13.5

## 2010-12-07 IMAGING — CT CT ANGIO CHEST
1 of 9 series · 18 of 37 positions shown · IV contrast (APPLIED)
Comparison: Chest radiograph performed 11/26/2005

CLINICAL DATA: Chest pain and elevated D-dimer.  Evaluate for
pulmonary embolism.

CT ANGIOGRAPHY CHEST WITH CONTRAST
TECHNIQUE: Multidetector CT imaging of the chest was performed
using the standard protocol during bolus administration of
intravenous contrast. Multiplanar CT image reconstructions
including MIPs were obtained to evaluate the vascular anatomy.
Contrast: 80 mL of Omnipaque 300 IV contrast

[Series 9: pulm embolism 1.0 b25f thins · axial · 0.54mm/px · z∈[-90,+158]mm · 18 of 275 slices shown]
[im 14/275  lung]
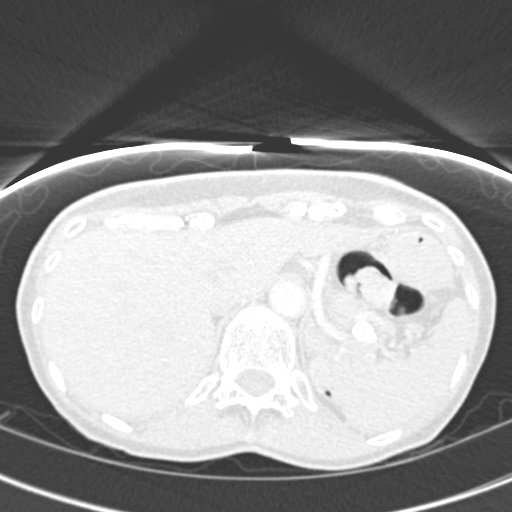
[im 28/275  mediastinal]
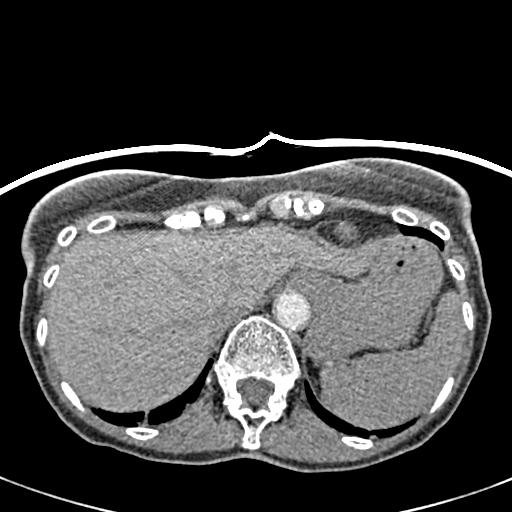
[im 42/275  lung]
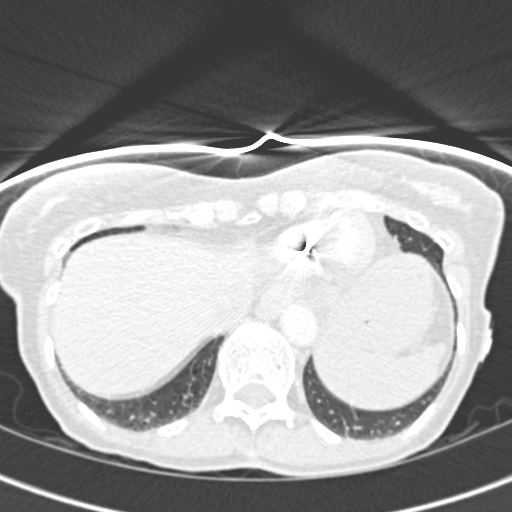
[im 55/275  mediastinal]
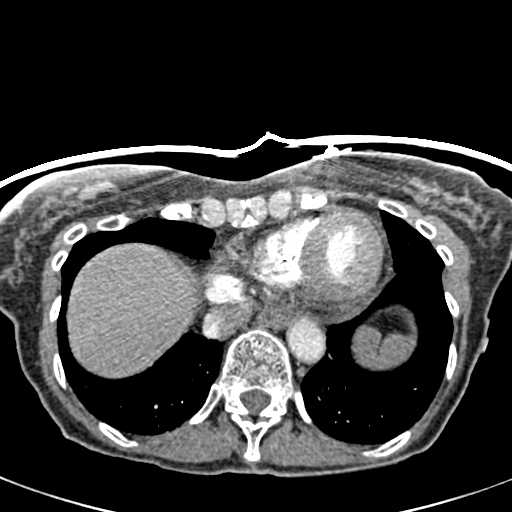
[im 69/275  lung]
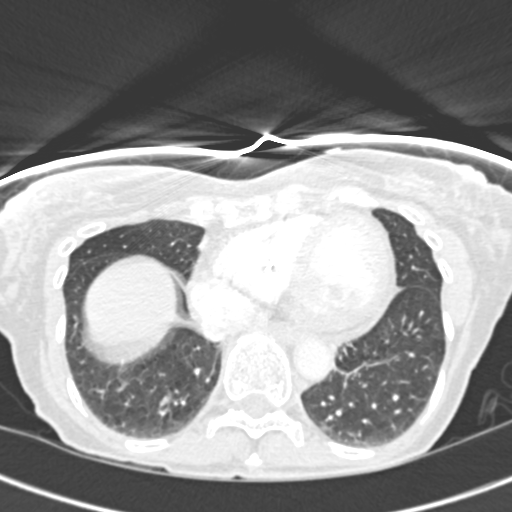
[im 83/275  mediastinal]
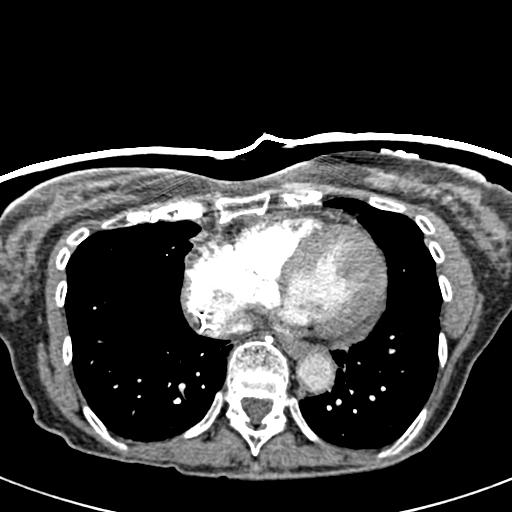
[im 96/275  lung]
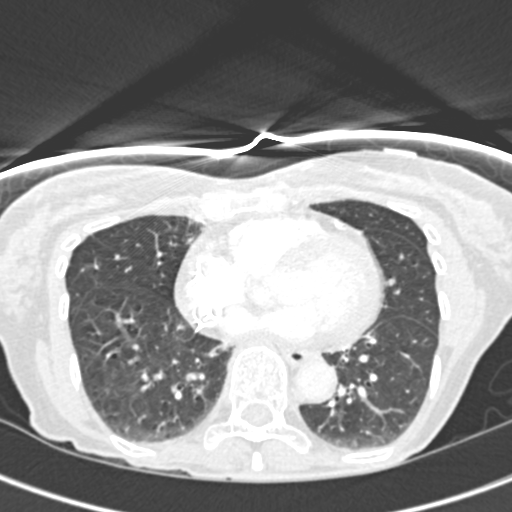
[im 110/275  mediastinal]
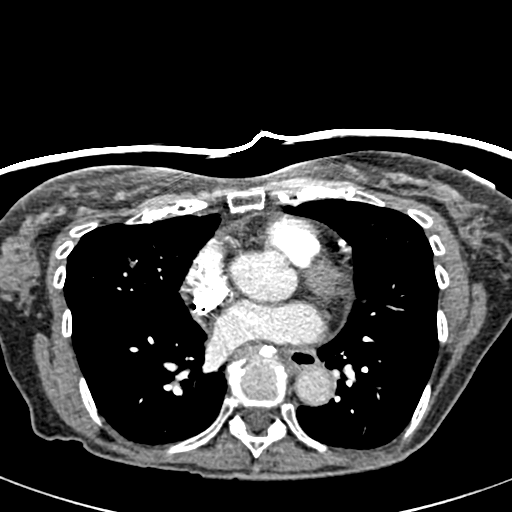
[im 124/275  lung]
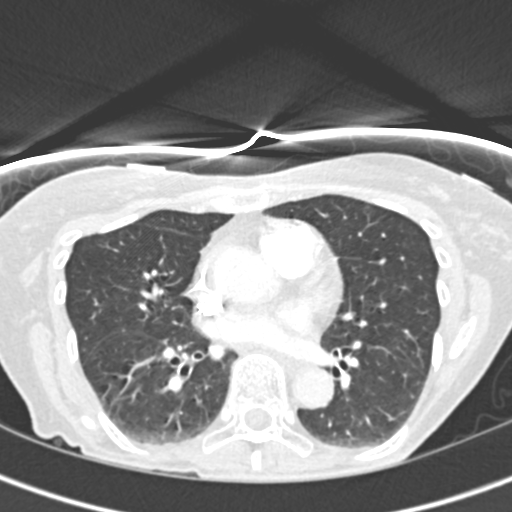
[im 151/275  mediastinal]
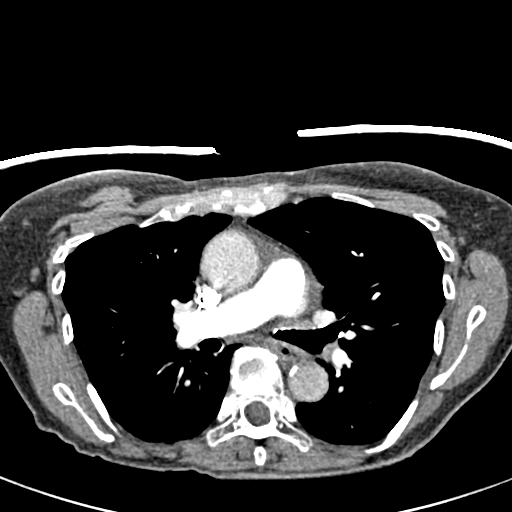
[im 165/275  lung]
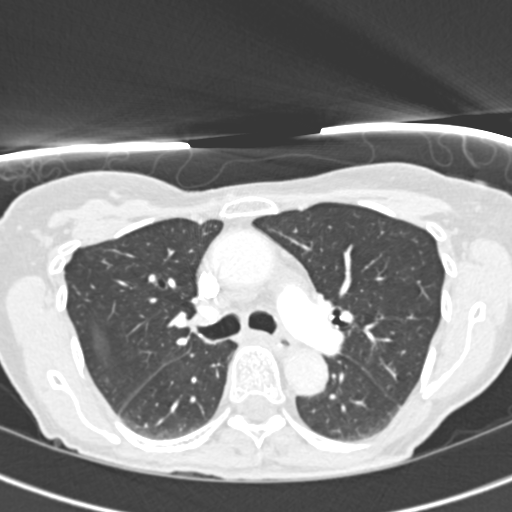
[im 179/275  mediastinal]
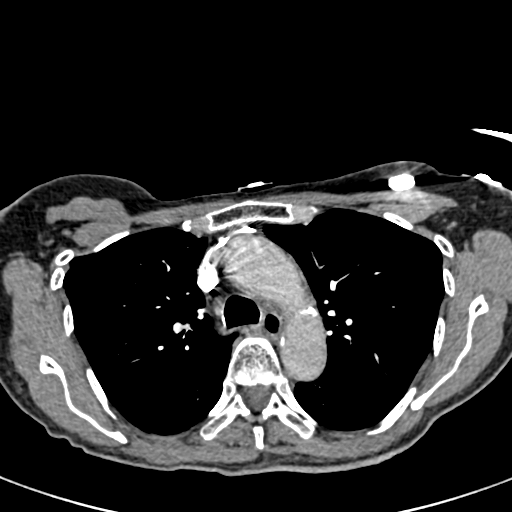
[im 192/275  lung]
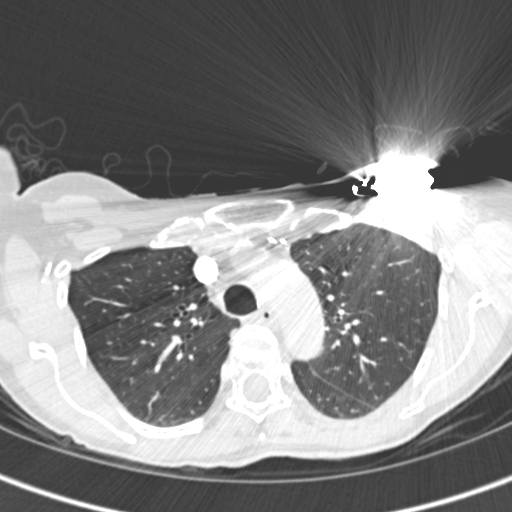
[im 206/275  mediastinal]
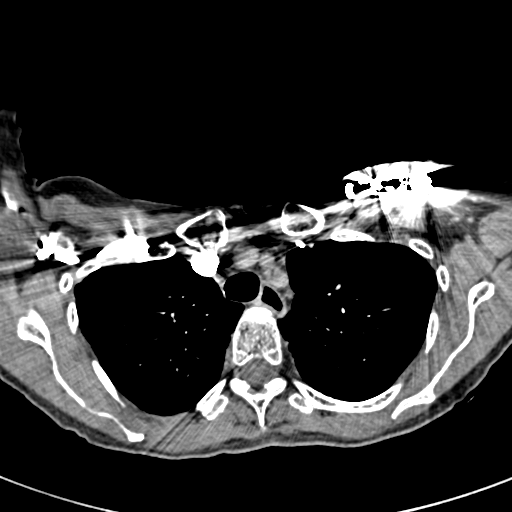
[im 220/275  lung]
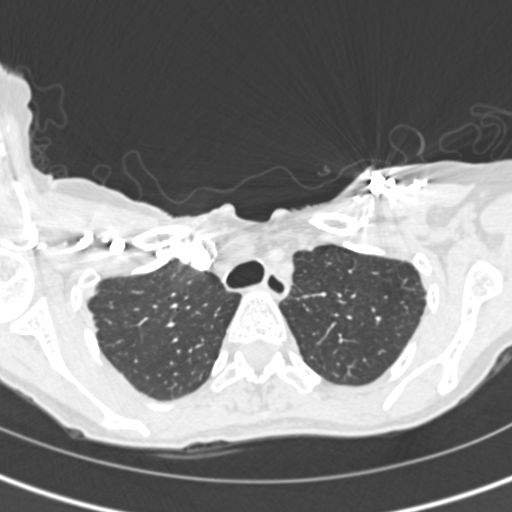
[im 233/275  mediastinal]
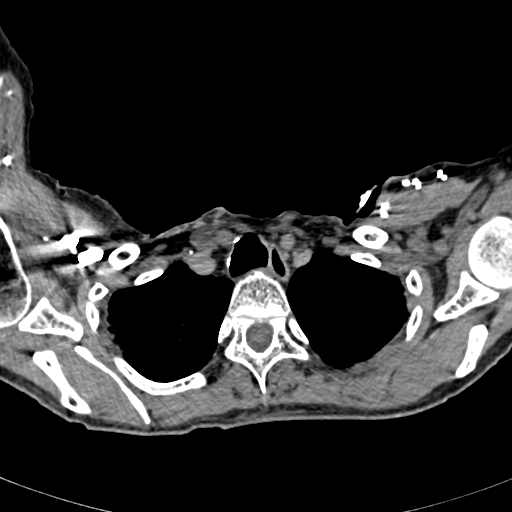
[im 247/275  lung]
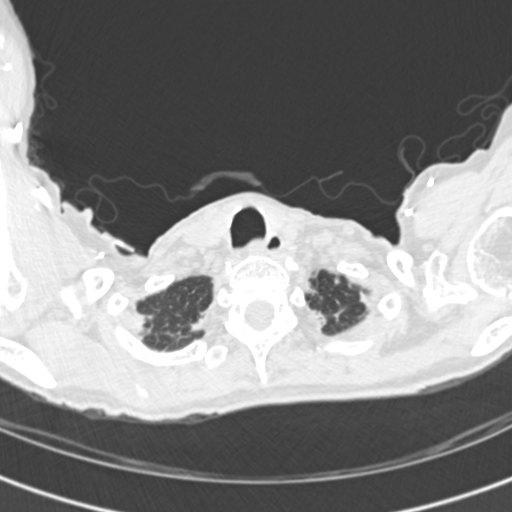
[im 261/275  mediastinal]
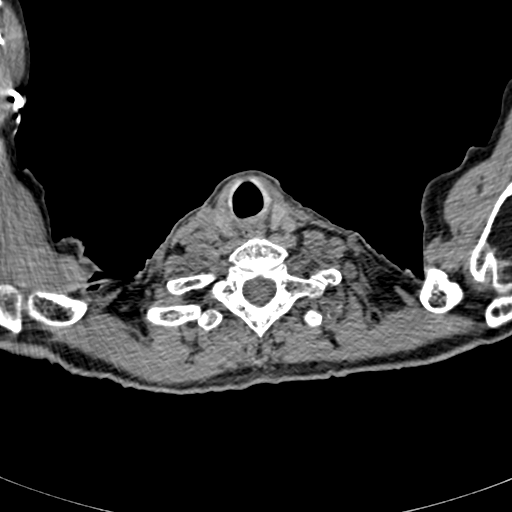

[18 of 37 positions shown; findings below may reference images not displayed]

FINDINGS: There is no evidence of pulmonary embolus.  Three
irregular spiculated nodules are identified within the right middle
lobe, with mild ground-glass characteristics.  The largest of these
measures 9 mm (image 55 of 92); a smaller nodule is noted
posteriorly in the right middle lobe, measuring 5 mm (image 46 of
92), and a third vague nodule is identified in the periphery of the
right middle lobe, measuring 6 mm (image 62 of 92). Additional
small nodular densities are noted within the periphery of the right
middle lobe.  Given that these findings are all in the right middle
lobe, concern is higher for infection than malignancy, although
malignancy cannot be excluded.

A vague 4 mm nodule is noted within the left upper lobe (image 54
of 92).  The lungs are otherwise essentially clear.  Biapical
scarring is noted.  There is no evidence of significant focal
consolidation, pleural effusion or pneumothorax.

The mediastinum is unremarkable in appearance.  No significant
mediastinal lymphadenopathy is noted.  A pacemaker is noted within
the left chest wall, with leads ending at the right atrium and
right ventricle.  No axillary lymphadenopathy is seen.  The thyroid
gland is unremarkable in appearance.

Note is made of a relatively dense 1.3 cm nodule at the 3 o'clock
position of the right breast.  Recommend correlation with prior
mammograms.

The visualized portions of the liver and spleen are unremarkable.
A calcified splenic artery aneurysm is identified at the splenic
hilum, measuring 1.2 cm in size.  A smaller calcification noted
more posteriorly may reflect an additional small splenic artery
aneurysm.  The visualized portions of the stomach are within normal
limits.

No acute osseous abnormalities are seen.

Review of the MIP images confirms the above findings.
IMPRESSION: 1.  No evidence of pulmonary embolus.
2.  Three spiculated poorly defined nodules identified within the
right middle lobe, with additional small nodular densities in the
periphery of the right middle lobe; given predominance in the right
middle lobe, this is more concerning for an infectious process,
although malignancy cannot be excluded. Smaller vague 4 mm nodule
noted in the left upper lobe.
3.  Biapical scarring; lungs otherwise clear.
4.  Relatively dense 1.3 cm nodule at the 3 o'clock position of the
right breast; recommend correlation with prior mammograms.
5. Calcified splenic artery aneurysm at the splenic hilum,
measuring 1.2 cm; additional possible smaller splenic artery
aneurysm seen.

## 2011-05-16 IMAGING — CR DG HIP COMPLETE 2+V*R*
2 series · 2 of 2 positions shown · non-contrast
Comparison: None available

CLINICAL DATA: Pelvic fracture, postop

RIGHT HIP - COMPLETE 2+ VIEW

[view not recorded (1 of 2)]
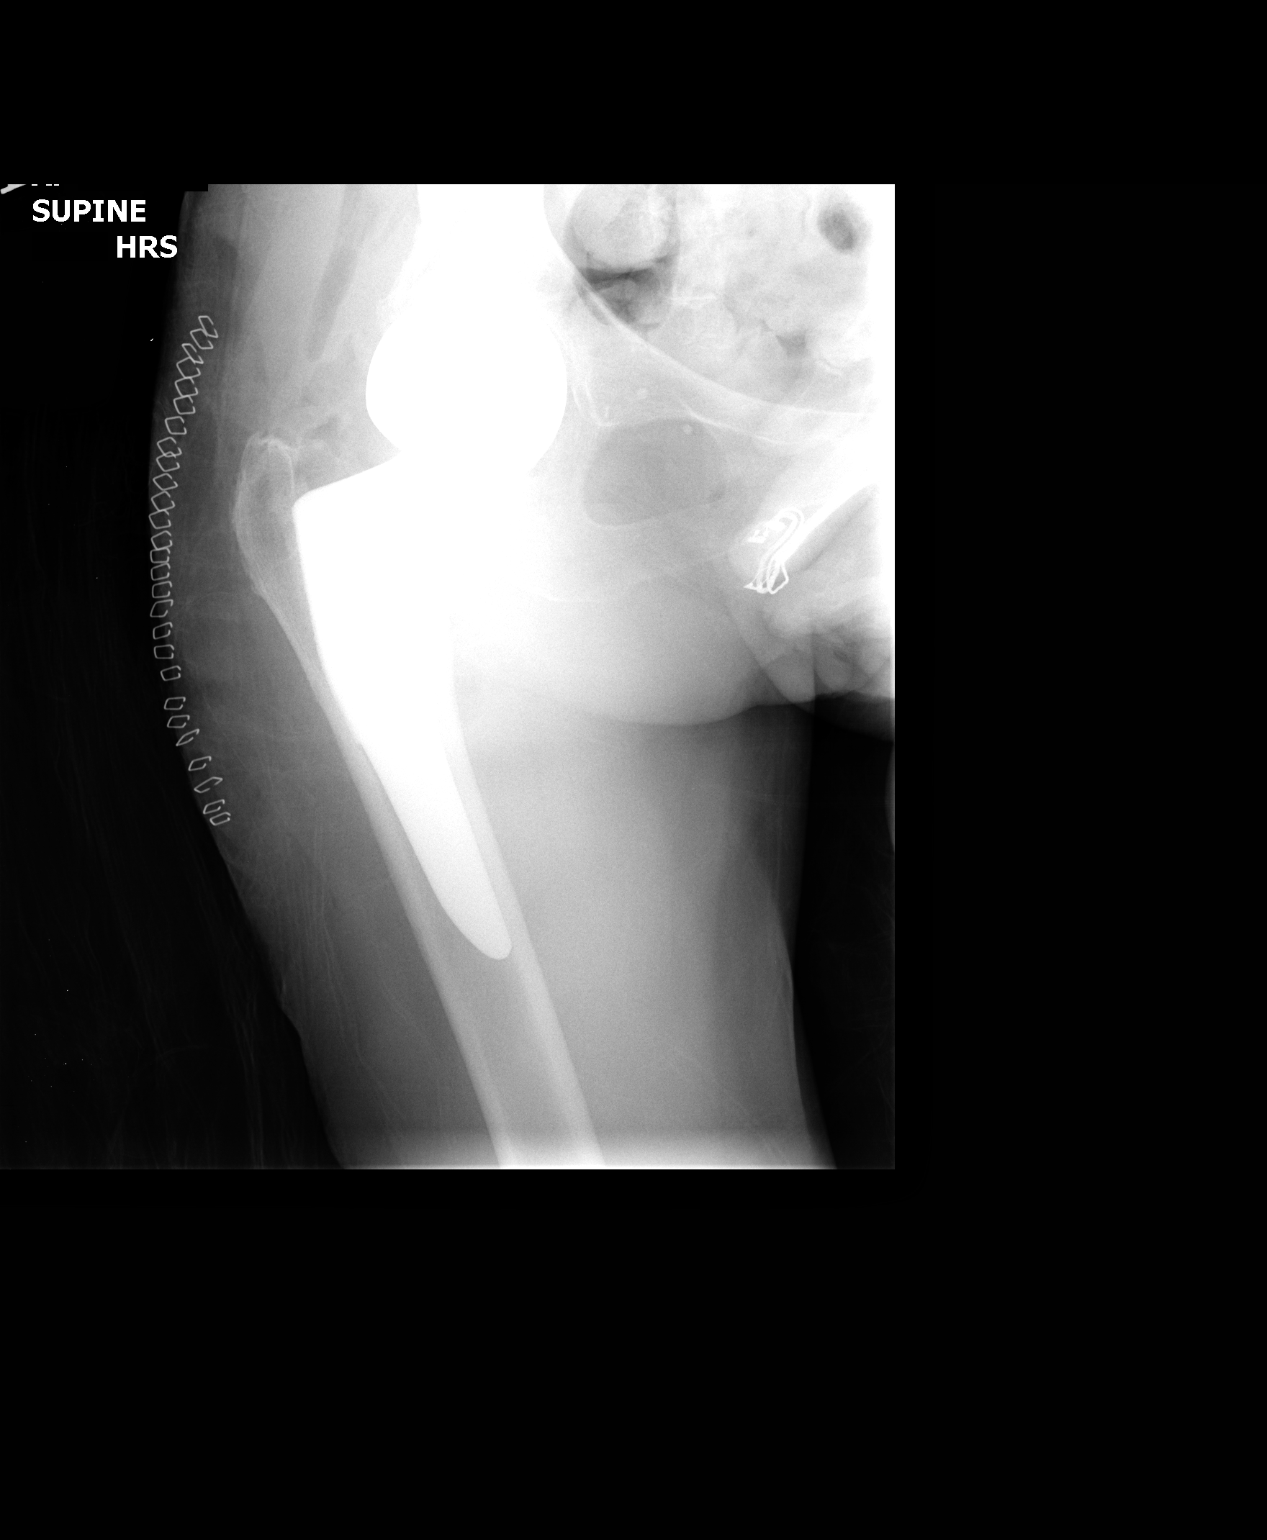

[view not recorded (2 of 2)]
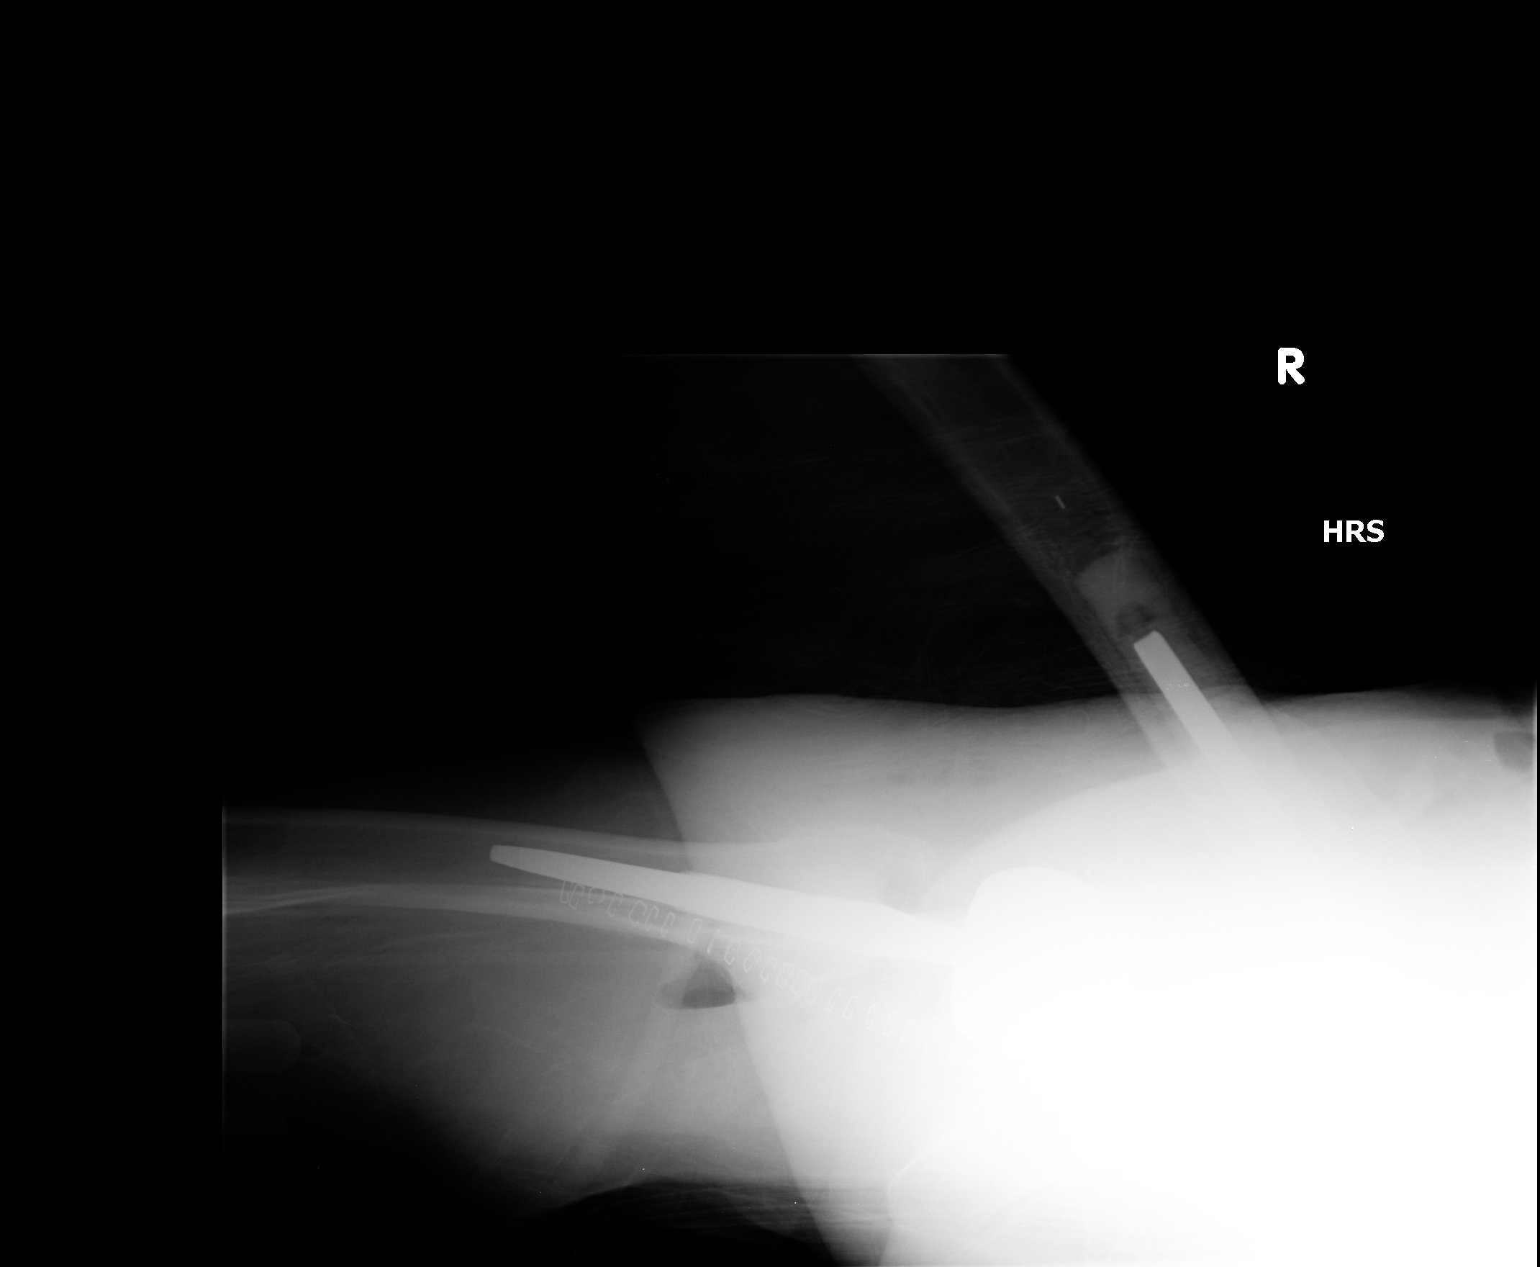

[2 of 2 positions shown; findings below may reference images not displayed]

FINDINGS: Right femoral head prosthesis projects in expected
location.  Negative for fracture.  Lateral skin staples.  Left
femoral prosthesis partially seen.
IMPRESSION: Right femoral head prosthesis

## 2012-04-12 ENCOUNTER — Encounter: Payer: Self-pay | Admitting: Cardiology

## 2013-01-12 ENCOUNTER — Encounter: Payer: Self-pay | Admitting: Internal Medicine

## 2013-07-28 ENCOUNTER — Telehealth: Payer: Self-pay | Admitting: Cardiology

## 2013-07-28 ENCOUNTER — Encounter: Payer: Self-pay | Admitting: *Deleted

## 2013-07-28 NOTE — Telephone Encounter (Signed)
Certified letter mailed.

## 2014-02-17 DEATH — deceased
